# Patient Record
Sex: Male | Born: 1950 | Race: White | Hispanic: No | Marital: Married | Smoking: Former smoker
Health system: Southern US, Community
[De-identification: ages and names within clinical notes are randomized; demographics above are authoritative.]

## PROBLEM LIST (undated history)

## (undated) DIAGNOSIS — R9431 Abnormal electrocardiogram [ECG] [EKG]: Secondary | ICD-10-CM

## (undated) DIAGNOSIS — I712 Thoracic aortic aneurysm, without rupture, unspecified: Secondary | ICD-10-CM

## (undated) DIAGNOSIS — I4891 Unspecified atrial fibrillation: Secondary | ICD-10-CM

## (undated) DIAGNOSIS — Q231 Congenital insufficiency of aortic valve: Secondary | ICD-10-CM

## (undated) DIAGNOSIS — I38 Endocarditis, valve unspecified: Secondary | ICD-10-CM

## (undated) DIAGNOSIS — I10 Essential (primary) hypertension: Secondary | ICD-10-CM

## (undated) DIAGNOSIS — I359 Nonrheumatic aortic valve disorder, unspecified: Secondary | ICD-10-CM

## (undated) HISTORY — DX: Thoracic aortic aneurysm, without rupture, unspecified: I71.20

## (undated) HISTORY — PX: CARDIAC CATHETERIZATION: SHX172

## (undated) HISTORY — PX: APPENDECTOMY: SHX54

## (undated) HISTORY — DX: Essential (primary) hypertension: I10

## (undated) HISTORY — DX: Congenital insufficiency of aortic valve: Q23.1

## (undated) HISTORY — DX: Endocarditis, valve unspecified: I38

## (undated) HISTORY — DX: Unspecified atrial fibrillation: I48.91

## (undated) HISTORY — DX: Abnormal electrocardiogram (ECG) (EKG): R94.31

## (undated) HISTORY — DX: Thoracic aortic aneurysm, without rupture: I71.2

## (undated) HISTORY — DX: Nonrheumatic aortic valve disorder, unspecified: I35.9

---

## 2003-12-06 ENCOUNTER — Encounter: Admission: RE | Admit: 2003-12-06 | Discharge: 2003-12-06 | Payer: Self-pay | Admitting: Family Medicine

## 2005-04-22 ENCOUNTER — Ambulatory Visit: Payer: Self-pay | Admitting: Family Medicine

## 2005-05-10 ENCOUNTER — Encounter: Admission: RE | Admit: 2005-05-10 | Discharge: 2005-05-10 | Payer: Self-pay | Admitting: Family Medicine

## 2005-05-10 ENCOUNTER — Ambulatory Visit: Payer: Self-pay | Admitting: Family Medicine

## 2005-05-25 ENCOUNTER — Ambulatory Visit: Payer: Self-pay

## 2005-06-10 ENCOUNTER — Encounter: Payer: Self-pay | Admitting: Family Medicine

## 2005-06-21 ENCOUNTER — Ambulatory Visit: Payer: Self-pay | Admitting: Cardiovascular Disease

## 2005-07-06 ENCOUNTER — Ambulatory Visit: Payer: Self-pay | Admitting: Family Medicine

## 2005-08-14 ENCOUNTER — Ambulatory Visit: Payer: Self-pay | Admitting: Cardiovascular Disease

## 2005-08-15 ENCOUNTER — Inpatient Hospital Stay (HOSPITAL_COMMUNITY): Admission: EM | Admit: 2005-08-15 | Discharge: 2005-08-18 | Payer: Self-pay | Admitting: Emergency Medicine

## 2005-08-27 ENCOUNTER — Ambulatory Visit: Payer: Self-pay | Admitting: Nurse Practitioner

## 2006-10-27 ENCOUNTER — Ambulatory Visit: Payer: Self-pay | Admitting: Cardiovascular Disease

## 2006-11-07 ENCOUNTER — Ambulatory Visit: Payer: Self-pay | Admitting: Internal Medicine

## 2006-11-08 ENCOUNTER — Inpatient Hospital Stay (HOSPITAL_COMMUNITY): Admission: EM | Admit: 2006-11-08 | Discharge: 2006-11-12 | Payer: Self-pay | Admitting: Emergency Medicine

## 2006-11-08 ENCOUNTER — Encounter: Payer: Self-pay | Admitting: Internal Medicine

## 2006-11-08 ENCOUNTER — Ambulatory Visit: Payer: Self-pay | Admitting: Internal Medicine

## 2006-11-08 ENCOUNTER — Ambulatory Visit: Payer: Self-pay | Admitting: Family Medicine

## 2006-11-09 ENCOUNTER — Ambulatory Visit: Payer: Self-pay | Admitting: Internal Medicine

## 2006-12-05 ENCOUNTER — Ambulatory Visit: Payer: Self-pay | Admitting: Family Medicine

## 2006-12-08 ENCOUNTER — Ambulatory Visit: Payer: Self-pay | Admitting: Cardiology

## 2006-12-08 ENCOUNTER — Ambulatory Visit (HOSPITAL_COMMUNITY): Admission: RE | Admit: 2006-12-08 | Discharge: 2006-12-08 | Payer: Self-pay | Admitting: Cardiology

## 2006-12-08 ENCOUNTER — Encounter: Payer: Self-pay | Admitting: Cardiology

## 2007-03-29 ENCOUNTER — Ambulatory Visit: Payer: Self-pay

## 2007-03-29 ENCOUNTER — Encounter: Payer: Self-pay | Admitting: Cardiology

## 2007-03-29 ENCOUNTER — Ambulatory Visit: Payer: Self-pay | Admitting: Cardiovascular Disease

## 2007-05-30 ENCOUNTER — Inpatient Hospital Stay (HOSPITAL_COMMUNITY): Admission: EM | Admit: 2007-05-30 | Discharge: 2007-06-03 | Payer: Self-pay | Admitting: Emergency Medicine

## 2007-05-30 ENCOUNTER — Encounter (INDEPENDENT_AMBULATORY_CARE_PROVIDER_SITE_OTHER): Payer: Self-pay | Admitting: Emergency Medicine

## 2007-05-30 ENCOUNTER — Ambulatory Visit: Payer: Self-pay | Admitting: Cardiology

## 2007-05-30 ENCOUNTER — Ambulatory Visit: Payer: Self-pay | Admitting: Internal Medicine

## 2007-05-31 ENCOUNTER — Encounter: Payer: Self-pay | Admitting: Cardiovascular Disease

## 2007-05-31 ENCOUNTER — Ambulatory Visit: Payer: Self-pay | Admitting: Infectious Diseases

## 2007-06-02 ENCOUNTER — Encounter: Payer: Self-pay | Admitting: Cardiovascular Disease

## 2007-06-19 ENCOUNTER — Ambulatory Visit: Payer: Self-pay | Admitting: Cardiovascular Disease

## 2007-12-04 ENCOUNTER — Ambulatory Visit: Payer: Self-pay | Admitting: Cardiovascular Disease

## 2007-12-04 LAB — CONVERTED CEMR LAB
ALT: 39 units/L (ref 0–53)
AST: 24 units/L (ref 0–37)
Alkaline Phosphatase: 79 units/L (ref 39–117)
Bilirubin, Direct: 0.1 mg/dL (ref 0.0–0.3)
Total Protein: 7.1 g/dL (ref 6.0–8.3)

## 2007-12-11 ENCOUNTER — Ambulatory Visit: Admission: RE | Admit: 2007-12-11 | Discharge: 2007-12-11 | Payer: Self-pay | Admitting: Cardiovascular Disease

## 2008-01-04 ENCOUNTER — Ambulatory Visit: Payer: Self-pay

## 2008-03-29 ENCOUNTER — Ambulatory Visit: Payer: Self-pay | Admitting: Cardiovascular Disease

## 2008-03-29 LAB — CONVERTED CEMR LAB
BUN: 15 mg/dL (ref 6–23)
Creatinine, Ser: 1 mg/dL (ref 0.4–1.5)
GFR calc Af Amer: 99 mL/min
GFR calc non Af Amer: 82 mL/min
Glucose, Bld: 93 mg/dL (ref 70–99)
Potassium: 4.2 meq/L (ref 3.5–5.1)

## 2008-04-16 ENCOUNTER — Ambulatory Visit: Payer: Self-pay | Admitting: Cardiovascular Disease

## 2008-04-16 ENCOUNTER — Ambulatory Visit (HOSPITAL_COMMUNITY): Admission: RE | Admit: 2008-04-16 | Discharge: 2008-04-16 | Payer: Self-pay | Admitting: Cardiovascular Disease

## 2008-10-14 ENCOUNTER — Ambulatory Visit: Payer: Self-pay | Admitting: Cardiovascular Disease

## 2008-10-17 ENCOUNTER — Ambulatory Visit: Payer: Self-pay

## 2009-04-08 DIAGNOSIS — I10 Essential (primary) hypertension: Secondary | ICD-10-CM | POA: Insufficient documentation

## 2009-04-08 DIAGNOSIS — Q231 Congenital insufficiency of aortic valve: Secondary | ICD-10-CM | POA: Insufficient documentation

## 2009-04-08 DIAGNOSIS — I359 Nonrheumatic aortic valve disorder, unspecified: Secondary | ICD-10-CM | POA: Insufficient documentation

## 2009-04-08 DIAGNOSIS — I4891 Unspecified atrial fibrillation: Secondary | ICD-10-CM | POA: Insufficient documentation

## 2009-04-09 ENCOUNTER — Encounter: Payer: Self-pay | Admitting: Cardiovascular Disease

## 2009-04-09 ENCOUNTER — Ambulatory Visit: Payer: Self-pay | Admitting: Cardiovascular Disease

## 2009-04-23 ENCOUNTER — Ambulatory Visit: Payer: Self-pay

## 2009-04-23 ENCOUNTER — Encounter: Payer: Self-pay | Admitting: Cardiovascular Disease

## 2009-07-22 ENCOUNTER — Ambulatory Visit: Payer: Self-pay | Admitting: Family Medicine

## 2009-07-22 LAB — CONVERTED CEMR LAB
ALT: 26 units/L (ref 0–53)
AST: 23 units/L (ref 0–37)
Alkaline Phosphatase: 85 units/L (ref 39–117)
Basophils Relative: 0.1 % (ref 0.0–3.0)
Bilirubin, Direct: 0.1 mg/dL (ref 0.0–0.3)
Blood in Urine, dipstick: NEGATIVE
Calcium: 9 mg/dL (ref 8.4–10.5)
Creatinine, Ser: 0.9 mg/dL (ref 0.4–1.5)
Eosinophils Absolute: 0.2 10*3/uL (ref 0.0–0.7)
Eosinophils Relative: 2.2 % (ref 0.0–5.0)
GFR calc non Af Amer: 91.57 mL/min (ref >60)
HDL: 45.1 mg/dL (ref >39.00)
Hemoglobin: 16.7 g/dL (ref 13.0–17.0)
Ketones, urine, test strip: NEGATIVE
LDL Cholesterol: 124 mg/dL — ABNORMAL HIGH (ref 0–99)
Lymphocytes Relative: 26.2 % (ref 12.0–46.0)
Monocytes Relative: 6.5 % (ref 3.0–12.0)
Neutrophils Relative %: 65 % (ref 43.0–77.0)
Nitrite: NEGATIVE
RBC: 4.59 M/uL (ref 4.22–5.81)
Sodium: 139 meq/L (ref 135–145)
Total CHOL/HDL Ratio: 4
Total Protein: 6.8 g/dL (ref 6.0–8.3)
Triglycerides: 117 mg/dL (ref 0.0–149.0)
Urobilinogen, UA: 1
WBC Urine, dipstick: NEGATIVE
WBC: 7.1 10*3/uL (ref 4.5–10.5)

## 2009-07-29 ENCOUNTER — Ambulatory Visit: Payer: Self-pay | Admitting: Family Medicine

## 2009-10-21 ENCOUNTER — Ambulatory Visit: Payer: Self-pay

## 2009-10-21 ENCOUNTER — Ambulatory Visit: Payer: Self-pay | Admitting: Cardiovascular Disease

## 2009-10-21 ENCOUNTER — Encounter: Payer: Self-pay | Admitting: Cardiovascular Disease

## 2009-10-21 ENCOUNTER — Ambulatory Visit (HOSPITAL_COMMUNITY): Admission: RE | Admit: 2009-10-21 | Discharge: 2009-10-21 | Payer: Self-pay | Admitting: Cardiovascular Disease

## 2009-10-21 DIAGNOSIS — R9431 Abnormal electrocardiogram [ECG] [EKG]: Secondary | ICD-10-CM | POA: Insufficient documentation

## 2009-12-27 HISTORY — PX: ASCENDING AORTIC ANEURYSM REPAIR W/ MECHANICAL AORTIC VALVE REPLACEMENT: SHX1192

## 2009-12-27 HISTORY — PX: PERICARDIAL WINDOW: SHX2213

## 2010-04-14 ENCOUNTER — Ambulatory Visit: Payer: Self-pay | Admitting: Cardiovascular Disease

## 2010-07-21 ENCOUNTER — Telehealth: Payer: Self-pay | Admitting: Family Medicine

## 2010-07-22 ENCOUNTER — Telehealth: Payer: Self-pay | Admitting: Family Medicine

## 2010-09-28 ENCOUNTER — Ambulatory Visit: Payer: Self-pay | Admitting: Family Medicine

## 2010-09-28 LAB — CONVERTED CEMR LAB
ALT: 19 units/L (ref 0–53)
Albumin: 4.1 g/dL (ref 3.5–5.2)
Basophils Relative: 0.7 % (ref 0.0–3.0)
Bilirubin, Direct: 0.1 mg/dL (ref 0.0–0.3)
Blood in Urine, dipstick: NEGATIVE
Creatinine, Ser: 0.8 mg/dL (ref 0.4–1.5)
Eosinophils Absolute: 0.1 10*3/uL (ref 0.0–0.7)
Eosinophils Relative: 1.3 % (ref 0.0–5.0)
Glucose, Bld: 102 mg/dL — ABNORMAL HIGH (ref 70–99)
Glucose, Urine, Semiquant: NEGATIVE
HCT: 44.8 % (ref 39.0–52.0)
HDL: 56.8 mg/dL (ref >39.00)
Lymphs Abs: 1.7 10*3/uL (ref 0.7–4.0)
MCHC: 34.7 g/dL (ref 30.0–36.0)
MCV: 103.7 fL — ABNORMAL HIGH (ref 78.0–100.0)
Monocytes Absolute: 0.5 10*3/uL (ref 0.1–1.0)
Neutro Abs: 6.8 10*3/uL (ref 1.4–7.7)
Neutrophils Relative %: 74.6 % (ref 43.0–77.0)
PSA: 0.48 ng/mL (ref 0.10–4.00)
Protein, U semiquant: NEGATIVE
RBC: 4.32 M/uL (ref 4.22–5.81)
Sodium: 138 meq/L (ref 135–145)
TSH: 1.92 microintl units/mL (ref 0.35–5.50)
Total Protein: 6.5 g/dL (ref 6.0–8.3)
Urobilinogen, UA: 0.2
VLDL: 10.4 mg/dL (ref 0.0–40.0)
WBC Urine, dipstick: NEGATIVE
WBC: 9.1 10*3/uL (ref 4.5–10.5)
pH: 5.5

## 2010-10-06 ENCOUNTER — Ambulatory Visit: Payer: Self-pay | Admitting: Family Medicine

## 2010-10-20 ENCOUNTER — Encounter (INDEPENDENT_AMBULATORY_CARE_PROVIDER_SITE_OTHER): Payer: Self-pay | Admitting: *Deleted

## 2010-10-23 ENCOUNTER — Ambulatory Visit: Payer: Self-pay | Admitting: Cardiovascular Disease

## 2010-10-23 ENCOUNTER — Encounter: Payer: Self-pay | Admitting: Cardiovascular Disease

## 2010-10-23 ENCOUNTER — Encounter (INDEPENDENT_AMBULATORY_CARE_PROVIDER_SITE_OTHER): Payer: Self-pay | Admitting: *Deleted

## 2010-10-23 ENCOUNTER — Ambulatory Visit (HOSPITAL_COMMUNITY): Admission: RE | Admit: 2010-10-23 | Discharge: 2010-10-23 | Payer: Self-pay | Admitting: Cardiovascular Disease

## 2010-10-23 ENCOUNTER — Ambulatory Visit: Payer: Self-pay | Admitting: Cardiology

## 2010-10-23 LAB — CONVERTED CEMR LAB
Basophils Absolute: 0.1 10*3/uL (ref 0.0–0.1)
Basophils Relative: 0.8 % (ref 0.0–3.0)
CO2: 33 meq/L — ABNORMAL HIGH (ref 19–32)
Calcium: 9 mg/dL (ref 8.4–10.5)
Creatinine, Ser: 0.9 mg/dL (ref 0.4–1.5)
Eosinophils Absolute: 0.1 10*3/uL (ref 0.0–0.7)
GFR calc non Af Amer: 97.4 mL/min (ref >60)
Hemoglobin: 16.1 g/dL (ref 13.0–17.0)
INR: 1 (ref 0.8–1.0)
Lymphocytes Relative: 33.6 % (ref 12.0–46.0)
MCHC: 34.9 g/dL (ref 30.0–36.0)
Monocytes Relative: 9 % (ref 3.0–12.0)
Neutro Abs: 4 10*3/uL (ref 1.4–7.7)
Neutrophils Relative %: 54.8 % (ref 43.0–77.0)
RBC: 4.46 M/uL (ref 4.22–5.81)
Sodium: 138 meq/L (ref 135–145)

## 2010-10-26 ENCOUNTER — Telehealth: Payer: Self-pay | Admitting: Internal Medicine

## 2010-11-02 ENCOUNTER — Telehealth (INDEPENDENT_AMBULATORY_CARE_PROVIDER_SITE_OTHER): Payer: Self-pay | Admitting: *Deleted

## 2010-11-02 ENCOUNTER — Encounter (INDEPENDENT_AMBULATORY_CARE_PROVIDER_SITE_OTHER): Payer: Self-pay | Admitting: *Deleted

## 2010-11-02 ENCOUNTER — Inpatient Hospital Stay (HOSPITAL_BASED_OUTPATIENT_CLINIC_OR_DEPARTMENT_OTHER): Admission: RE | Admit: 2010-11-02 | Discharge: 2010-11-02 | Payer: Self-pay | Admitting: Cardiovascular Disease

## 2010-11-02 DIAGNOSIS — I712 Thoracic aortic aneurysm, without rupture, unspecified: Secondary | ICD-10-CM | POA: Insufficient documentation

## 2010-11-06 ENCOUNTER — Ambulatory Visit: Payer: Self-pay | Admitting: Cardiology

## 2010-11-10 ENCOUNTER — Encounter: Payer: Self-pay | Admitting: Cardiovascular Disease

## 2010-11-13 ENCOUNTER — Telehealth: Payer: Self-pay | Admitting: Cardiovascular Disease

## 2010-11-17 ENCOUNTER — Ambulatory Visit: Payer: Self-pay | Admitting: Cardiovascular Disease

## 2010-11-24 ENCOUNTER — Encounter: Payer: Self-pay | Admitting: Family Medicine

## 2010-11-24 ENCOUNTER — Ambulatory Visit: Payer: Self-pay | Admitting: Surgery

## 2010-12-01 ENCOUNTER — Ambulatory Visit: Payer: Self-pay | Admitting: Surgery

## 2010-12-03 ENCOUNTER — Encounter: Payer: Self-pay | Admitting: Cardiovascular Disease

## 2010-12-03 ENCOUNTER — Inpatient Hospital Stay (HOSPITAL_COMMUNITY)
Admission: RE | Admit: 2010-12-03 | Discharge: 2010-12-08 | Payer: Self-pay | Source: Home / Self Care | Attending: Surgery | Admitting: Surgery

## 2010-12-03 ENCOUNTER — Encounter: Payer: Self-pay | Admitting: Surgery

## 2010-12-06 ENCOUNTER — Encounter: Payer: Self-pay | Admitting: Cardiovascular Disease

## 2010-12-08 ENCOUNTER — Encounter: Payer: Self-pay | Admitting: Cardiovascular Disease

## 2010-12-10 ENCOUNTER — Ambulatory Visit: Payer: Self-pay | Admitting: Internal Medicine

## 2010-12-10 LAB — CONVERTED CEMR LAB: POC INR: 5

## 2010-12-11 ENCOUNTER — Encounter: Payer: Self-pay | Admitting: Cardiology

## 2010-12-11 ENCOUNTER — Inpatient Hospital Stay (HOSPITAL_COMMUNITY)
Admission: EM | Admit: 2010-12-11 | Discharge: 2010-12-16 | Payer: Self-pay | Source: Home / Self Care | Attending: Cardiothoracic Surgery | Admitting: Cardiothoracic Surgery

## 2010-12-17 ENCOUNTER — Ambulatory Visit: Payer: Self-pay

## 2010-12-18 ENCOUNTER — Ambulatory Visit: Payer: Self-pay | Admitting: Cardiology

## 2010-12-22 ENCOUNTER — Ambulatory Visit: Payer: Self-pay | Admitting: Surgery

## 2010-12-22 ENCOUNTER — Encounter
Admission: RE | Admit: 2010-12-22 | Discharge: 2010-12-22 | Payer: Self-pay | Source: Home / Self Care | Attending: Surgery | Admitting: Surgery

## 2010-12-22 ENCOUNTER — Telehealth: Payer: Self-pay | Admitting: Cardiovascular Disease

## 2010-12-25 ENCOUNTER — Telehealth: Payer: Self-pay | Admitting: Cardiovascular Disease

## 2010-12-25 ENCOUNTER — Ambulatory Visit: Payer: Self-pay | Admitting: Cardiology

## 2010-12-25 LAB — CONVERTED CEMR LAB: POC INR: 3.1

## 2010-12-29 ENCOUNTER — Other Ambulatory Visit: Payer: Self-pay | Admitting: Cardiology

## 2010-12-29 ENCOUNTER — Ambulatory Visit: Admission: RE | Admit: 2010-12-29 | Discharge: 2010-12-29 | Payer: Self-pay | Source: Home / Self Care

## 2010-12-29 ENCOUNTER — Ambulatory Visit (HOSPITAL_COMMUNITY)
Admission: RE | Admit: 2010-12-29 | Discharge: 2010-12-29 | Payer: Self-pay | Source: Home / Self Care | Attending: Cardiology | Admitting: Cardiology

## 2011-01-01 ENCOUNTER — Ambulatory Visit: Admission: RE | Admit: 2011-01-01 | Discharge: 2011-01-01 | Payer: Self-pay | Source: Home / Self Care

## 2011-01-15 ENCOUNTER — Ambulatory Visit: Admission: RE | Admit: 2011-01-15 | Discharge: 2011-01-15 | Payer: Self-pay | Source: Home / Self Care

## 2011-01-21 ENCOUNTER — Ambulatory Visit
Admission: RE | Admit: 2011-01-21 | Discharge: 2011-01-21 | Payer: Self-pay | Source: Home / Self Care | Attending: Cardiovascular Disease | Admitting: Cardiovascular Disease

## 2011-01-26 NOTE — Assessment & Plan Note (Signed)
Summary: cpx//ccm   Vital Signs:  Patient profile:   60 year old male Weight:      208 pounds Temp:     98.7 degrees F oral Pulse rate:   67 / minute Pulse rhythm:   regular BP sitting:   140 / 68  (left arm) Cuff size:   regular  Vitals Entered By: Mervin Hack CMA Duncan Dull) (October 06, 2010 2:52 PM) CC: adult physical   CC:  adult physical.  History of Present Illness: Alexander Payne is a 60 year old, married male, nonsmoker comes in today for evaluation of hypertension, and aortic stenosis.  His last echocardiogram showed an ejection fraction of between 55 and 60%.  Mean gradient was 36, however, he is asymptomatic.  He does not really exert himself.  But he has no chest pain, shortness of breath, lightheadedness, et Karie Soda.  He is currently on lisinopril 5 mg daily, beta-blocker, 25 mg one half tablet b.i.d., aspirin daily, along with amiodiorone 200 mg daily because of a history of PAF.  he  was told recently that he has the beginnings of cataracts, although his vision is able to be corrected 20/ 20 with glasses.  Rescue systems negative.  Tetanus 2004 season flu shot today.  He said his screening flexible sigmoidoscopy in the past, but never has had a colonoscopy............ again referred for colonoscopy!!!!!!!!!!!  Allergies: No Known Drug Allergies  Past History:  Past medical, surgical, family and social histories (including risk factors) reviewed, and no changes noted (except as noted below).  Past Medical History: Reviewed history from 04/09/2009 and no changes required. Hypertension AS bicuspid valve PAF Hypertension  Past Surgical History: Reviewed history from 04/08/2009 and no changes required.  Significant for coronary disease.   Family History: Reviewed history from 04/09/2009 and no changes required. non-contributory  Social History: Reviewed history from 04/09/2009 and no changes required. Works at [employer] Non-smoker Non-drinker Sedentary  Review of Systems      See HPI  Physical Exam  General:  Well-developed,well-nourished,in no acute distress; alert,appropriate and cooperative throughout examination Head:  Normocephalic and atraumatic without obvious abnormalities. No apparent alopecia or balding. Eyes:  No corneal or conjunctival inflammation noted. EOMI. Perrla. Funduscopic exam benign, without hemorrhages, exudates or papilledema. Vision grossly normal. Ears:  External ear exam shows no significant lesions or deformities.  Otoscopic examination reveals clear canals, tympanic membranes are intact bilaterally without bulging, retraction, inflammation or discharge. Hearing is grossly normal bilaterally. Nose:  External nasal examination shows no deformity or inflammation. Nasal mucosa are pink and moist without lesions or exudates. Mouth:  Oral mucosa and oropharynx without lesions or exudates.  Teeth in good repair. Neck:  No deformities, masses, or tenderness noted. Chest Wall:  No deformities, masses, tenderness or gynecomastia noted. Breasts:  No masses or gynecomastia noted Lungs:  Normal respiratory effort, chest expands symmetrically. Lungs are clear to auscultation, no crackles or wheezes. Heart:  the PMI is not palpable.  There is a grade 4/6 systolic ejection murmur heard in the carotids.  It also audible in the aorta.  No diastolic murmur Abdomen:  Bowel sounds positive,abdomen soft and non-tender without masses, organomegaly or hernias noted. Rectal:  No external abnormalities noted. Normal sphincter tone. No rectal masses or tenderness. Genitalia:  Testes bilaterally descended without nodularity, tenderness or masses. No scrotal masses or lesions. No penis lesions or urethral discharge. Prostate:  Prostate gland firm and smooth, no enlargement, nodularity, tenderness, mass, asymmetry or induration. Msk:  No deformity or scoliosis  noted of thoracic or lumbar spine.    Pulses:  R and L carotid,radial,femoral,dorsalis pedis and posterior tibial pulses are full and equal bilaterally Extremities:  No clubbing, cyanosis, edema, or deformity noted with normal full range of motion of all joints.   Neurologic:  No cranial nerve deficits noted. Station and gait are normal. Plantar reflexes are down-going bilaterally. DTRs are symmetrical throughout. Sensory, motor and coordinative functions appear intact. Skin:  Intact without suspicious lesions or rashes Cervical Nodes:  No lymphadenopathy noted Axillary Nodes:  No palpable lymphadenopathy Inguinal Nodes:  No significant adenopathy Psych:  Cognition and judgment appear intact. Alert and cooperative with normal attention span and concentration. No apparent delusions, illusions, hallucinations   Impression & Recommendations:  Problem # 1:  HYPERTENSION (ICD-401.9) Assessment Improved  His updated medication list for this problem includes:    Lisinopril 5 Mg Tabs (Lisinopril) .Marland Kitchen... Take 1 tablet by mouth once a day    Metoprolol Tartrate 25 Mg Tabs (Metoprolol tartrate) .Marland Kitchen... 1/2 tab by mouth two times a day  Orders: Prescription Created Electronically [phone])  Problem # 2:  AORTIC STENOSIS (ICD-424.1) Assessment: Unchanged  His updated medication list for this problem includes:    Amiodarone Hcl 200 Mg Tabs (Amiodarone hcl) .Marland Kitchen... Take one tab by mouth once daily    Metoprolol Tartrate 25 Mg Tabs (Metoprolol tartrate) .Marland Kitchen... 1/2 tab by mouth two times a day    Aspirin 325 Mg Tabs (Aspirin) .Marland Kitchen... 1 tab by mouth once daily  Orders: Prescription Created Electronically [phone])  Problem # 3:  Preventive Health Care (ICD-V70.0) Assessment: Unchanged  Complete Medication List: 1)  Amiodarone Hcl 200 Mg Tabs (Amiodarone hcl) .... Take one tab by mouth once daily 2)  Lisinopril 5 Mg Tabs (Lisinopril) .... Take 1 tablet by mouth once a day 3)  Metoprolol Tartrate 25 Mg Tabs (Metoprolol tartrate) .... 1/2 tab by  mouth two times a day 4)  Aspirin 325 Mg Tabs (Aspirin) .Marland Kitchen.. 1 tab by mouth once daily 5)  Fish Oil Oil (Fish oil) .Marland Kitchen.. 1 tab by mouth once daily 6)  Multivitamins Tabs (Multiple vitamin) .Marland Kitchen.. 1 podaily 7)  Krill Oil 1000 Mg Caps (Krill oil) .Marland Kitchen.. 1 by mouth daily  Other Orders: Flu Vaccine 37yrs + [phone]) Admin 1st Vaccine (91478) Admin 1st Vaccine [facility]) [id number]) Gastroenterology Referral (GI)  Patient Instructions: 1)  Please schedule a follow-up appointment in 1 year. 2)  Schedule a colonoscopy/sigmoidoscopy to help detect colon cancer. Prescriptions: METOPROLOL TARTRATE 25 MG TABS (METOPROLOL TARTRATE) 1/2 tab by mouth two times a day  #100 x 3   Entered and Authorized by:   Roderick Pee MD   Signed by:   Roderick Pee MD on 10/06/2010   Method used:   Print then Give to Patient   RxID:   [id number] LISINOPRIL 5 MG TABS (LISINOPRIL) Take 1 tablet by mouth once a day  #100 x 3   Entered and Authorized by:   Roderick Pee MD   Signed by:   Roderick Pee MD on 10/06/2010   Method used:   Print then Give to Patient   RxID:   [id number] AMIODARONE HCL 200 MG TABS (AMIODARONE HCL) take one tab by mouth once daily  #100 x 3   Entered and Authorized by:   Roderick Pee MD   Signed by:   Roderick Pee MD on 10/06/2010   Method used:   Print then Give to Patient   RxID:  [id number] METOPROLOL TARTRATE 25 MG TABS (METOPROLOL TARTRATE) 1/2 tab by mouth two times a day  #100 x 3   Entered and Authorized by:   Roderick Pee MD   Signed by:   Roderick Pee MD on 10/06/2010   Method used:   Electronically to        [facility] (retail)       [address]       [place], [place]  [address]       Ph: [phone]       Fax: [phone]   RxID:   [id number] LISINOPRIL 5 MG TABS (LISINOPRIL) Take 1 tablet by mouth once a day  #100 x 3   Entered and Authorized by:   Roderick Pee MD   Signed by:   Roderick Pee MD on 10/06/2010   Method used:    Electronically to        [facility] (retail)       [address]       [place], [place]  [address]       Ph: [phone]       Fax: [phone]   RxID:   [id number] AMIODARONE HCL 200 MG TABS (AMIODARONE HCL) take one tab by mouth once daily  #100 x 3   Entered and Authorized by:   Roderick Pee MD   Signed by:   Roderick Pee MD on 10/06/2010   Method used:   Electronically to        [facility] (retail)       [address]       [place], [place]  [address]       Ph: [phone]       Fax: [phone]   RxID:   [id number]    Influenza Vaccine    Vaccine Type: Fluvax 3+    Site: left deltoid    Mfr: GlaxoSmithKline    Dose: 0.5 ml    Route: IM    Given by: Mervin Hack CMA (AAMA)    Exp. Date: 06/26/2011    Lot #: [id number]    VIS given: 07/21/10 version given October 06, 2010.  Flu Vaccine Consent Questions    Do you have a history of severe allergic reactions to this vaccine? no    Any prior history of allergic reactions to egg and/or gelatin? no    Do you have a sensitivity to the preservative Thimersol? no    Do you have a past history of Guillan-Barre Syndrome? no    Do you currently have an acute febrile illness? no    Have you ever had a severe reaction to latex? no    Vaccine information given and explained to patient? yes   Current Allergies (reviewed today): No known allergies

## 2011-01-26 NOTE — Consult Note (Signed)
Summary: [facility]  [facility]   Imported By: Sherian Rein 06/06/2010 08:47:46  _____________________________________________________________________  External Attachment:    Type:   Image     Comment:   External Document

## 2011-01-26 NOTE — Miscellaneous (Signed)
  Clinical Lists Changes  Orders: Added new Referral order of [facility]CTS Referral (TCTS Ref) - Signed

## 2011-01-26 NOTE — Assessment & Plan Note (Signed)
Summary: [name]   CC:  check up.  History of Present Illness: Alexander Payne is seen today for F/U of AS.  He has a known bicuspid valve.  Previous mean gradient was around 40mm Hg.  I reviewed his echo from today and mean gradient is now 55 with peak velocity up to 4.8 cm/sec from right parasternal.  Septal thickness is 16mm  Despite continuing to be "aysmptomatic" I think the patient should proceed to AVR given the progression of disease by echo criteria.  At age 29 he may opt for a pericardial valve and no coumadin.  His BP is under good control.  He has had a nonischemic myovue I believe in 2008 with no known CAD.  We will need to do an aortogram to size his aortic root.  The indications for proceeding with cath and AVR were discussed.  Risks of stroke and bleeding with cath discussed.  Reviewed last two echos with patient.  Suggested Dr Laneta Simmers as Careers adviser and will arrange appt with him post cath.    Current Problems (verified): 1)  Routine General Medical Exam@health  Care Facl  (ICD-V70.0) 2)  Electrocardiogram, Abnormal  (ICD-794.31) 3)  Bicuspid Aortic Valve  (ICD-746.4) 4)  Paroxysmal Atrial Fibrillation  (ICD-427.31) 5)  Hypertension  (ICD-401.9) 6)  Aortic Stenosis  (ICD-424.1)  Current Medications (verified): 1)  Amiodarone Hcl 200 Mg Tabs (Amiodarone Hcl) .... Take One Tab By Mouth Once Daily 2)  Lisinopril 5 Mg Tabs (Lisinopril) .... Take 1 Tablet By Mouth Once A Day 3)  Metoprolol Tartrate 25 Mg Tabs (Metoprolol Tartrate) .... 1/2 Tab By Mouth Two Times A Day 4)  Aspirin 325 Mg  Tabs (Aspirin) .Marland Kitchen.. 1 Tab By Mouth Once Daily 5)  Fish Oil   Oil (Fish Oil) .Marland Kitchen.. 1 Tab By Mouth Once Daily 6)  Multivitamins   Tabs (Multiple Vitamin) .Marland Kitchen.. 1 Podaily  Allergies (verified): No Known Drug Allergies  Past History:  Past Medical History: Last updated: 04/09/2009 Hypertension AS bicuspid valve PAF Hypertension  Past Surgical History: Last updated: 04/08/2009  Significant for coronary  disease.   Family History: Last updated: 04/09/2009 non-contributory  Social History: Last updated: 04/09/2009 Works at [employer] Non-smoker Non-drinker Sedentary  Review of Systems       Denies fever, malais, weight loss, blurry vision, decreased visual acuity, cough, sputum, SOB, hemoptysis, pleuritic pain, palpitaitons, heartburn, abdominal pain, melena, lower extremity edema, claudication, or rash.   Vital Signs:  Patient profile:   60 year old male Height:      75 inches Weight:      211 pounds BMI:     26.47 Pulse rate:   56 / minute Resp:     14 per minute BP sitting:   141 / 67  (left arm)  Vitals Entered By: Kem Parkinson (October 23, 2010 11:27 AM)  Physical Exam  General:  Affect appropriate Healthy:  appears stated age HEENT: normal Neck supple with no adenopathy JVP normal no bruits no thyromegaly Lungs clear with no wheezing and good diaphragmatic motion Heart:  S1/S2  gone  AS and AR  murmur no ,rub, gallop or click PMI normal Abdomen: benighn, BS positve, no tenderness, no AAA no bruit.  No HSM or HJR Distal pulses intact with no bruits No edema Neuro non-focal Skin warm and dry    Impression & Recommendations:  Problem # 1:  BICUSPID AORTIC VALVE (ICD-746.4) Severe with progression of disease.  CAth and surgical referral to be arranged His updated medication  list for this problem includes:    Amiodarone Hcl 200 Mg Tabs (Amiodarone hcl) .Marland Kitchen... Take one tab by mouth once daily    Lisinopril 5 Mg Tabs (Lisinopril) .Marland Kitchen... Take 1 tablet by mouth once a day    Metoprolol Tartrate 25 Mg Tabs (Metoprolol tartrate) .Marland Kitchen... 1/2 tab by mouth two times a day    Aspirin 325 Mg Tabs (Aspirin) .Marland Kitchen... 1 tab by mouth once daily  Problem # 2:  PAROXYSMAL ATRIAL FIBRILLATION (ICD-427.31) Maint NSR  Increase risk of PAF post op.  Continue amiodarone His updated medication list for this problem includes:    Amiodarone Hcl 200 Mg Tabs  (Amiodarone hcl) .Marland Kitchen... Take one tab by mouth once daily    Metoprolol Tartrate 25 Mg Tabs (Metoprolol tartrate) .Marland Kitchen... 1/2 tab by mouth two times a day    Aspirin 325 Mg Tabs (Aspirin) .Marland Kitchen... 1 tab by mouth once daily  Orders: TLB-CBC Platelet - w/Differential (85025-CBCD) TLB-PT (Protime) (85610-PTP)  Problem # 3:  HYPERTENSION (ICD-401.9) Well controlled Avoid excess afterload reduction with fixed AS His updated medication list for this problem includes:    Lisinopril 5 Mg Tabs (Lisinopril) .Marland Kitchen... Take 1 tablet by mouth once a day    Metoprolol Tartrate 25 Mg Tabs (Metoprolol tartrate) .Marland Kitchen... 1/2 tab by mouth two times a day    Aspirin 325 Mg Tabs (Aspirin) .Marland Kitchen... 1 tab by mouth once daily  Orders: TLB-BMP (Basic Metabolic Panel-BMET) (80048-METABOL)  Other Orders: Cardiac Catheterization (Cardiac Cath) T-2 View CXR (71020TC)  Patient Instructions: 1)  Your physician recommends that you schedule a follow-up appointment in: 4 WEEKS 2)  Your physician has requested that you have a cardiac catheterization.  Cardiac catheterization is used to diagnose and/or treat various heart conditions. Doctors may recommend this procedure for a number of different reasons. The most common reason is to evaluate chest pain. Chest pain can be a symptom of coronary artery disease (CAD), and cardiac catheterization can show whether plaque is narrowing or blocking your heart's arteries. This procedure is also used to evaluate the valves, as well as measure the blood flow and oxygen levels in different parts of your heart.  For further information please visit [email or web address]  Please follow instruction sheet, as given.

## 2011-01-26 NOTE — Miscellaneous (Signed)
Summary: Orders Update  Clinical Lists Changes  Orders: Added new Referral order of [facility] Referral ([facility] Ref) - Signed 

## 2011-01-26 NOTE — Progress Notes (Signed)
  Phone Note Other Incoming   Caller: dr Charlton Haws Summary of Call: pt needing to be scheduled for CTA of chest for evaluation of dilated aortic root. scheduled for friday 11-06-10@ 1pm in the [place] office on [address]. pt aware. 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

## 2011-01-26 NOTE — Letter (Signed)
Summary: Pre Visit Letter Revised  [facility]  [address] [place], [place] [address]   Phone: [phone]  Fax: [phone]        10/20/2010 MRN: [id number] [facility] [address] [place], [place]  [address]             Procedure Date:  11-26-10   Welcome to the Gastroenterology Division at [facility].    You are scheduled to see a nurse for your pre-procedure visit on 11-12-10 at 8:00a.m. on the 3rd floor at [facility], [address]  We ask that you try to arrive at our office 15 minutes prior to your appointment time to allow for check-in.  Please take a minute to review the attached form.  If you answer "Yes" to one or more of the questions on the first page, we ask that you call the person listed at your earliest opportunity.  If you answer "No" to all of the questions, please complete the rest of the form and bring it to your appointment.    Your nurse visit will consist of discussing your medical and surgical history, your immediate family medical history, and your medications.   If you are unable to list all of your medications on the form, please bring the medication bottles to your appointment and we will list them.  We will need to be aware of both prescribed and over the counter drugs.  We will need to know exact dosage information as well.    Please be prepared to read and sign documents such as consent forms, a financial agreement, and acknowledgement forms.  If necessary, and with your consent, a friend or relative is welcome to sit-in on the nurse visit with you.  Please bring your insurance card so that we may make a copy of it.  If your insurance requires a referral to see a specialist, please bring your referral form from your primary care physician.  No co-pay is required for this nurse visit.     If you cannot keep your appointment, please call [phone] to cancel or reschedule prior to your appointment date.  This  allows [place] the opportunity to schedule an appointment for another patient in need of care.    Thank you for choosing [facility] for your medical needs.  We appreciate the opportunity to care for you.  Please visit [place] at our website  to learn more about our practice.  Sincerely, The Gastroenterology Division

## 2011-01-26 NOTE — Miscellaneous (Signed)
Summary: Appointment Canceled  Appointment status changed to canceled by [employer] on 10/06/2010 4:59 PM.  Cancellation Comments --------------------- echo-AS  Appointment Information ----------------------- Appt Type:  CARDIOLOGY ANCILLARY VISIT      Date:  Thursday, October 15, 2010      Time:  9:30 AM for 60 min   Urgency:  Routine   Made By:  Pearson Grippe  To Visit:  [id number]    Reason:  echo-AS  Appt Comments ------------- -- 10/06/10 16:59: ([facility]) CANCELED -- echo-AS -- 08/14/10 16:12: ([facility]) BOOKED -- Routine CARDIOLOGY ANCILLARY VISIT at 10/15/2010 9:30 AM for 60 min echo-AS

## 2011-01-26 NOTE — Consult Note (Signed)
Summary: [facility]  [facility]   Imported By: Maryln Gottron 12/03/2010 10:07:11  _____________________________________________________________________  External Attachment:    Type:   Image     Comment:   External Document

## 2011-01-26 NOTE — Progress Notes (Signed)
Summary: Pt changing from [facility] to [facility]. Sending refill req  Phone Note Refill Request Message from:  Pharmacy on July 22, 2010 10:06 AM  Refills Requested: Medication #1:  METOPROLOL TARTRATE 25 MG TABS 1/2 tab by mouth two times a day   Dosage confirmed as above?Dosage Confirmed  Medication #2:  LISINOPRIL 5 MG TABS Take 1 tablet by mouth once a day   Dosage confirmed as above?Dosage Confirmed Initial call taken by: Kern Reap CMA Duncan Dull),  July 22, 2010 10:59 AM Caller: spouse - Erskine Squibb Summary of Call: [facility] is faxing a req to get pts Metoprolol, Lisinopril and Amiodarone 200mg . Pt is changing from [facility] to [facility] order.  Pt just wanted to make Dr. Tawanna Cooler aware.  Initial call taken by: Lucy Antigua,  July 21, 2010 11:35 AM    Prescriptions: METOPROLOL TARTRATE 25 MG TABS (METOPROLOL TARTRATE) 1/2 tab by mouth two times a day  #100 x 3   Entered by:   Kern Reap CMA (AAMA)   Authorized by:   Roderick Pee MD   Signed by:   Kern Reap CMA (AAMA) on 07/22/2010   Method used:   Faxed to ...       [facility] (mail-order)             , [place]         Ph: [phone]       Fax: [phone]   RxID:   [id number] LISINOPRIL 5 MG TABS (LISINOPRIL) Take 1 tablet by mouth once a day  #100 x 3   Entered by:   Kern Reap CMA (AAMA)   Authorized by:   Roderick Pee MD   Signed by:   Kern Reap CMA (AAMA) on 07/22/2010   Method used:   Faxed to ...       [facility] (mail-order)             , [place]         Ph: [phone]       Fax: [phone]   RxID:   [id number]

## 2011-01-26 NOTE — Assessment & Plan Note (Signed)
Summary: F1M/POST CATH/DM   CC:  follow up.  History of Present Illness: Alexander Payne is seen post cath. He has severe AS and moderate AR with progressive gradient.  Cors showed only 30-40% LAD disease probably not severe enough to bypass.  CT showed ascending aorta of 4.8 and he will need a Bental.  I suspect that he would be best served with a mechanical valve but I am not sure he wants to be on coumadin.  He will discuss this further with Dr Laneta Simmers  He will likely have surgery after the holidays.  He denies SSCP, syncope and only mild exertinal dyspnea.  Cath sight has healed well.  Compliant with meds.  Will continue amiodarone to decrease risk of perop afib.  Risk of pacer post op alos discussed with patient.  Cardiovascular Risk History:      Positive major cardiovascular risk factors include male age 60 years old or older and hypertension.        Positive history for target organ damage include peripheral vascular disease.     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 Take One Tab By Mouth Once Daily 2)  Lisinopril 5 Mg Tabs (Lisinopril) .... Take 1 Tablet By Mouth Once A Day 3)  Metoprolol Tartrate 25 Mg Tabs (Metoprolol Tartrate) .... 1/2 Tab By Mouth Two Times A Day 4)  Aspirin 325 Mg  Tabs (Aspirin) .Marland Kitchen.. 1 Tab By Mouth Once Daily 5)  Fish Oil   Oil (Fish Oil) .Marland Kitchen.. 1 Tab By Mouth Once Daily 6)  Multivitamins   Tabs (Multiple Vitamin) .Marland Kitchen.. 1 Podaily  Allergies (verified): No Known Drug Allergies  Past History:  Past Medical History: Last updated: 04/09/2009 Hypertension AS bicuspid  valve PAF Hypertension  Past Surgical History: Last updated: 04/08/2009  Significant for coronary disease.   Family History: Last updated: 04/09/2009 non-contributory  Social History: Last updated: 04/09/2009 Works at [employer] Non-smoker Non-drinker Sedentary  Review of Systems       Denies fever, malais, weight loss, blurry vision, decreased visual acuity, cough, sputum, SOB, hemoptysis, pleuritic pain, palpitaitons, heartburn, abdominal pain, melena, lower extremity edema, claudication, or rash.   Vital Signs:  Patient profile:   60 year old male Height:      75 inches Weight:      216 pounds BMI:     27.10 Pulse rate:   62 / minute Resp:     14 per minute BP sitting:   142 / 74  (left arm)  Vitals Entered By: Kem Parkinson (November 17, 2010 9:58 AM)  Physical Exam  General:  Affect appropriate Healthy:  appears stated age HEENT: normal Neck supple with no adenopathy JVP normal no bruits no thyromegaly Lungs clear with no wheezing and good diaphragmatic motion Heart:  S1/S2  severe AS murmur and AR murmur no ,rub, gallop or click PMI normal Abdomen: benighn, BS positve, no tenderness, no AAA no bruit.  No HSM or HJR Distal pulses intact with no bruits No edema Neuro non-focal Skin warm and dry    Impression & Recommendations:  Problem # 1:  ANEURYSM, THORACIC AORTIC (ICD-441.2) Will need Bental procedure.  Problem # 2:  BICUSPID AORTIC VALVE (ICD-746.4) To see Dr Laneta Simmers regarding AVR and likely proceed with surgery including root replacement after the holidays His updated medication list for this problem includes:    Amiodarone Hcl 200 Mg Tabs (Amiodarone hcl) .Marland Kitchen... Take one tab by mouth once daily    Lisinopril 5 Mg Tabs (Lisinopril) .Marland Kitchen... Take 1 tablet by mouth once a day    Metoprolol Tartrate 25 Mg Tabs (Metoprolol tartrate) .Marland Kitchen... 1/2 tab by mouth two times a day    Aspirin 325 Mg Tabs (Aspirin) .Marland Kitchen... 1 tab by mouth  once daily  Problem # 3:  HYPERTENSION (ICD-401.9) Well controlled.  Avoid excessive afterload reduction with fixed AS His updated medication list for this problem includes:    Lisinopril 5 Mg Tabs (Lisinopril) .Marland Kitchen... Take 1 tablet by mouth once a day    Metoprolol Tartrate 25 Mg Tabs (Metoprolol tartrate) .Marland Kitchen... 1/2 tab by mouth two times a day    Aspirin 325 Mg Tabs (Aspirin) .Marland Kitchen... 1 tab by mouth once daily  Problem # 4:  PAROXYSMAL ATRIAL FIBRILLATION (ICD-427.31) Maint NSR consider D/C amiodarone 1 month post op. His updated medication list for this problem includes:    Amiodarone Hcl 200 Mg Tabs (Amiodarone hcl) .Marland Kitchen... Take one tab by mouth once daily    Metoprolol Tartrate 25 Mg Tabs (Metoprolol tartrate) .Marland Kitchen... 1/2 tab by mouth two times a day    Aspirin 325 Mg Tabs (Aspirin) .Marland Kitchen... 1 tab by mouth once daily  Cardiovascular Risk Assessment/Plan:      The patient's hypertensive risk group is category C: Target organ damage and/or diabetes.  His calculated 10 year risk of coronary heart disease is 18 %.  Today's blood pressure is 142/74.    Patient Instructions: 1)  Your physician recommends that you schedule a follow-up appointment in: JAN

## 2011-01-26 NOTE — Progress Notes (Signed)
Summary: calling about CT scan  Phone Note Call from Patient Call back at Work Phone [phone]   Caller: Patient Summary of Call: Pt calling regarding his CT scan,and pt want to know if he should keep appt with Dr.Nishan on 11/17/10 Initial call taken by: Judie Grieve,  November 13, 2010 4:40 PM  Follow-up for Phone Call        Phone Call Completed pt instructed to keep appt with dr Eden Emms and also will need to make arrangements for pt to see dr bartle  for avr.per pt dr bartle's off has not made appt. Follow-up by: Scherrie Bateman, LPN,  November 13, 2010 4:57 PM

## 2011-01-26 NOTE — Progress Notes (Signed)
Summary: previsit letter ?'s  Phone Note Call from Patient Call back at Work Phone [phone]   Caller: Patient Call For: Dr. Leone Payor Reason for Call: Talk to Nurse Summary of Call: previsit letter ?'s Initial call taken by: Vallarie Mare,  October 26, 2010 9:37 AM  Follow-up for Phone Call        pt has a bicuspid aortic valve and will be having a cath on 11/7.  Pt is scheduled for colon on 12/1.  Does the patient need to wait for cardiac clearance or is he OK to continue as scheduled? Follow-up by: Francee Piccolo CMA Duncan Dull),  October 26, 2010 2:03 PM  Additional Follow-up for Phone Call Additional follow up Details #1::        I would not do it now due to aortic stenosis work-up. cancel the procedure and replace the recall for 6 months from now and will review things then - assuming it is screening which is what it looks like cc: Dr. Eden Emms when note complete Additional Follow-up by: Iva Boop MD, Clementeen Graham,  October 27, 2010 6:16 AM    Additional Follow-up for Phone Call Additional follow up Details #2::    notified pt of above.  He is agreeable.  Appt's cancelled in [facility].  Recall for 6 months placed in IDX.  Advised pt to call us if he begins having GI problems, otherwise we will call him in 6 month to schedule. Follow-up by: Francee Piccolo CMA Duncan Dull),  October 27, 2010 9:39 AM

## 2011-01-26 NOTE — Progress Notes (Signed)
Summary: refills  Phone Note Refill Request      New/Updated Medications: AMIODARONE HCL 200 MG TABS (AMIODARONE HCL) take one tab by mouth once daily Prescriptions: LISINOPRIL 5 MG TABS (LISINOPRIL) Take 1 tablet by mouth once a day  #100 x 3   Entered by:   Kern Reap CMA (AAMA)   Authorized by:   Roderick Pee MD   Signed by:   Kern Reap CMA (AAMA) on 07/22/2010   Method used:   Faxed to ...       [facility] (mail-order)             , [place]         Ph: [phone]       Fax: [phone]   RxID:   [id number] METOPROLOL TARTRATE 25 MG TABS (METOPROLOL TARTRATE) 1/2 tab by mouth two times a day  #100 x 3   Entered by:   Kern Reap CMA (AAMA)   Authorized by:   Roderick Pee MD   Signed by:   Kern Reap CMA (AAMA) on 07/22/2010   Method used:   Faxed to ...       [facility] (mail-order)             , [place]         Ph: [phone]       Fax: [phone]   RxID:   [id number] AMIODARONE HCL 200 MG TABS (AMIODARONE HCL) take one tab by mouth once daily  #90 x 3   Entered by:   Kern Reap CMA (AAMA)   Authorized by:   Roderick Pee MD   Signed by:   Kern Reap CMA (AAMA) on 07/22/2010   Method used:   Faxed to ...       [facility] (mail-order)             , [place]         Ph: [phone]       Fax: [phone]   RxID:   [id number]

## 2011-01-26 NOTE — Letter (Signed)
Summary: Cardiac Catheterization Instructions- [facility]  [facility], Main Office  [address]   [place], [place] [address]   Phone: [phone]  Fax: [phone]     10/23/2010 MRN: [id number]  Alexander Payne [address] [place], [place]  [address]  Dear [name],   You are scheduled for a Cardiac Catheterization on [facility] 11-02-10 with Dr. Eden Emms  Please arrive to the 1st floor of the Heart and Vascular Center at [facility] at     7:30 am       on the day of your procedure. Please do not arrive before 6:30 a.m. Call the Heart and Vascular Center at [phone] if you are unable to make your appointmnet. The Code to get into the parking garage under the building is  2000. Take the elevators to the 1st floor. You must have someone to drive you home. Someone must be with you for the first 24 hours after you arrive home. Please wear clothes that are easy to get on and off and wear slip-on shoes. Do not eat or drink after midnight except water with your medications that morning. Bring all your medications and current insurance cards with you.  ___ DO NOT take these medications before your procedure: ________________________________________________________________  ___ Make sure you take your aspirin.  _XX__ You may take ALL of your medications with water that morning. ________________________________________________________________________________________________________________________________  ___ DO NOT take ANY medications before your procedure.  ___ Pre-med instructions:  ________________________________________________________________________________________________________________________________  The usual length of stay after your procedure is 2 to 3 hours. This can vary.  If you have any questions, please call the office at the number listed above.   Deliah Goody, RN

## 2011-01-26 NOTE — Assessment & Plan Note (Signed)
Summary: F6M/DM   History of Present Illness: Alexander Payne is following up today for severe aortic stenosis.  His last echo October 2010 showed a mean gradient of 36 mm mercury the peak gradient of 61 mmHg.  Septal thickness is 16.5 mm.  His ejection fraction was normal at 55-65%.  He had mild aortic regurgitation.  He had a Myoview study October 22 of a non-is nonischemic.  I questioned him quite closely and he truly is asymptomatic in regards to not having any significant chest pain palpitations or presyncope.  There's been no exertional dyspnea.  I spent a long time going over the indications for valve surgery with Molly Maduro.  However at 46 and being asymptomatic I think it is appropriate to not rushed to surgery.  If we can given through the next few years he may be a candidate for tissue valve.  Certainly if he needed mechanical valve he has a much better chance at having to have one surgery if we can get him into his early 2s.  He understands warning signs.  We have screened  him for coronary artery disease.  He continues to work a full job at [employer].  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  He is fairly sedentary but has no problems doing work around the house and walking.  I told him he needs a followup echo.  Clearly if his LV function is changed or  gradients of markedly increased we may rethink things in regards to his valve.  He also knows the importance of keeping his dentition in good shape.  Current Problems (verified): 1)  Electrocardiogram, Abnormal  (ICD-794.31) 2)  Bicuspid Aortic Valve  (ICD-746.4) 3)  Paroxysmal Atrial Fibrillation  (ICD-427.31) 4)  Hypertension  (ICD-401.9) 5)  Aortic Stenosis  (ICD-424.1)  Current Medications (verified): 1)  Lisinopril 5 Mg Tabs (Lisinopril) .... Take 1 Tablet By Mouth Once A Day 2)  Metoprolol Tartrate 25 Mg Tabs (Metoprolol Tartrate) .... 1/2 Tab By Mouth Two Times A Day 3)  Aspirin 325 Mg  Tabs (Aspirin) .Marland Kitchen.. 1 Tab By Mouth Once Daily 4)  Fish Oil    Oil (Fish Oil) .Marland Kitchen.. 1 Tab By Mouth Once Daily 5)  Multivitamins   Tabs (Multiple Vitamin) .Marland Kitchen.. 1 Podaily 6)  Krill Oil 1000 Mg Caps (Krill Oil) .Marland Kitchen.. 1 By Mouth Daily  Allergies (verified): No Known Drug Allergies  Past History:  Past Medical History: Last updated: 04/09/2009 Hypertension AS bicuspid valve PAF Hypertension  Past Surgical History: Last updated: 04/08/2009  Significant for coronary disease.   Family History: Last updated: 04/09/2009 non-contributory  Social History: Last updated: 04/09/2009 Works at [employer] Non-smoker Non-drinker Sedentary  Review of Systems       Denies fever, malais, weight loss, blurry vision, decreased visual acuity, cough, sputum, SOB, hemoptysis, pleuritic pain, palpitaitons, heartburn, abdominal pain, melena, lower extremity edema, claudication, or rash.   Vital Signs:  Patient profile:   60 year old male Height:      75 inches Weight:      215 pounds BMI:     26.97 Pulse rate:   65 / minute Resp:     18 per minute BP sitting:   124 / 69  (right arm)  Vitals Entered By: Marrion Coy, CNA (April 14, 2010 8:10 AM)  Physical Exam  General:  Affect appropriate Healthy:  appears stated age HEENT: normal Neck supple with no adenopathy JVP normal no bruits no thyromegaly Lungs clear with no wheezing and good  diaphragmatic motion Heart:  S1/S2 diminished with  AS murmur, no rub, gallop or click PMI normal Abdomen: benighn, BS positve, no tenderness, no AAA no bruit.  No HSM or HJR Distal pulses intact with no bruits No edema Neuro non-focal Skin warm and dry    Impression & Recommendations:  Problem # 1:  ELECTROCARDIOGRAM, ABNORMAL (ICD-794.31) Likely from LVH compared to 1010 no change The following medications were removed from the medication list:    Amiodarone Hcl 200 Mg Tabs (Amiodarone hcl) .Marland Kitchen... Take 1 tablet by mouth once a day His updated medication list for this problem includes:     Lisinopril 5 Mg Tabs (Lisinopril) .Marland Kitchen... Take 1 tablet by mouth once a day    Metoprolol Tartrate 25 Mg Tabs (Metoprolol tartrate) .Marland Kitchen... 1/2 tab by mouth two times a day    Aspirin 325 Mg Tabs (Aspirin) .Marland Kitchen... 1 tab by mouth once daily  Problem # 2:  BICUSPID AORTIC VALVE (ICD-746.4) Asymptomatic severe AS.  Normal stress test last year.  F/U echo in 6 months The following medications were removed from the medication list:    Amiodarone Hcl 200 Mg Tabs (Amiodarone hcl) .Marland Kitchen... Take 1 tablet by mouth once a day His updated medication list for this problem includes:    Lisinopril 5 Mg Tabs (Lisinopril) .Marland Kitchen... Take 1 tablet by mouth once a day    Metoprolol Tartrate 25 Mg Tabs (Metoprolol tartrate) .Marland Kitchen... 1/2 tab by mouth two times a day    Aspirin 325 Mg Tabs (Aspirin) .Marland Kitchen... 1 tab by mouth once daily  Problem # 3:  HYPERTENSION (ICD-401.9) Well controlled.  Do not increase lisinopril due to fixed after load with valve disease His updated medication list for this problem includes:    Lisinopril 5 Mg Tabs (Lisinopril) .Marland Kitchen... Take 1 tablet by mouth once a day    Metoprolol Tartrate 25 Mg Tabs (Metoprolol tartrate) .Marland Kitchen... 1/2 tab by mouth two times a day    Aspirin 325 Mg Tabs (Aspirin) .Marland Kitchen... 1 tab by mouth once daily  Patient Instructions: 1)  Your physician has requested that you have an echocardiogram.  Echocardiography is a painless test that uses sound waves to create images of your heart. It provides your doctor with information about the size and shape of your heart and how well your heart's chambers and valves are working.  This procedure takes approximately one hour. There are no restrictions for this procedure. IN 6 MONTHS--See Dr Eden Emms the same day. 2)  Your physician wants you to follow-up in: 6 months with Dr Eden Emms.  You will receive a reminder letter in the mail two months in advance. If you don't receive a letter, please call our office to schedule the follow-up appointment. HAVE AN ECHO THE  SAME DAY YOU SEE DR Eden Emms   EKG Report  Procedure date:  04/14/2010  Findings:      NSR 65 LVH with strain No change from 10/10  Appended Document: F6M/DM    Clinical Lists Changes  Orders: Added new Referral order of Echocardiogram (Echo) - Signed

## 2011-01-28 NOTE — Medication Information (Signed)
Summary: rov  Anticoagulant Therapy  Managed by: Weston Brass, PharmD Referring MD: Patience Musca MD: Shirlee Latch MD, Freida Busman Indication 1: Aortic Valve Replacement Indication 2: Atrial Fibrillation Valve Type: [facility] -- Mechanical Lab Used: [facility] [place] Site: [address] INR POC 2.0 INR RANGE 2.5-3.0  Dietary changes: no    Health status changes: no    Bleeding/hemorrhagic complications: no    Recent/future hospitalizations: no    Any changes in medication regimen? no    Recent/future dental: no  Any missed doses?: no       Is patient compliant with meds? yes       Allergies: No Known Drug Allergies  Anticoagulation Management History:      The patient is taking warfarin and comes in today for a routine follow up visit.  Negative risk factors for bleeding include an age less than 10 years old.  The bleeding index is 'low risk'.  Positive CHADS2 values include History of HTN.  Negative CHADS2 values include Age > 83 years old.  His last INR was 1.0 ratio.  Anticoagulation responsible provider: Shirlee Latch MD, Angelus Hoopes.  INR POC: 2.0.  Cuvette Lot#: [id number].  Exp: 12/2011.    Anticoagulation Management Assessment/Plan:      The target INR is 2.5-3.0.  The next INR is due 01/29/2011.  Anticoagulation instructions were given to patient.  Results were reviewed/authorized by Weston Brass, PharmD.  He was notified by Stephannie Peters, PharmD Candidate .         Prior Anticoagulation Instructions: INR 3.1 Skip Saturdays dose then change dose to 2.5mg s everyday except 1mg s on Mondays. Recheck in one week.   Samples given # 4 tabs Lot # [id number] 01/2011  Current Anticoagulation Instructions: INR 2.0  Coumadin 5 mg tablets - Take 1 whole tablet today, then take 1/2 tablet every day

## 2011-01-28 NOTE — Op Note (Signed)
Summary: [facility]  [facility]   Imported By: Marylou Mccoy 12/23/2010 17:43:55  _____________________________________________________________________  External Attachment:    Type:   Image     Comment:   External Document

## 2011-01-28 NOTE — Medication Information (Signed)
Summary: new to coumadin/sp  Anticoagulant Therapy  Managed by: Weston Brass, PharmD Referring MD: Patience Musca MD: Graciela Husbands MD, Viviann Spare Indication 1: Aortic Valve Replacement Indication 2: Atrial Fibrillation Valve Type: [facility] -- Mechanical Lab Used: [facility] [place] Site: [address] INR POC 5.0 INR RANGE 2.5-3.0     Recent/future hospitalizations: yes       Details: Pt admitted from 12/9-12/13 for AVR with atrial fibrillation post op  Any changes in medication regimen? yes       Details: pt has been on amiodarone prior to this hospitalization     Comments: Pt and wife educated on bleeding risks, dietary concerns and medication interactions.  Pt received 5mg  x 3 days and 7.5mg  x 2 days in hospital.  INR on 12/13 was 2.25.  Discharged on 5mg  daily.  Per Dr. Eden Emms- INR range 2.5-3.0  Allergies: No Known Drug Allergies  Anticoagulation Management History:      The patient is taking warfarin and comes in today for a routine follow up visit.  Negative risk factors for bleeding include an age less than 42 years old.  The bleeding index is 'low risk'.  Positive CHADS2 values include History of HTN.  Negative CHADS2 values include Age > 81 years old.  His last INR was 1.0 ratio.  Anticoagulation responsible provider: Graciela Husbands MD, Viviann Spare.  INR POC: 5.0.  Cuvette Lot#: [id number].  Exp: 01/2012.    Anticoagulation Management Assessment/Plan:      The target INR is 2.5-3.0.  The next INR is due 12/17/2010.  Anticoagulation instructions were given to patient.  Results were reviewed/authorized by Weston Brass, PharmD.  He was notified by Weston Brass PharmD.         Current Anticoagulation Instructions: INR 5.0  Skip today's dose of Coumadin then decrease dose to 1/2 tablet every day except 1 tablet on Monday, Wednesday and Friday.  Recheck INR in 1 week.

## 2011-01-28 NOTE — Letter (Signed)
Summary: [facility]  [facility]   Imported By: Roderic Ovens 12/25/2010 14:38:13  _____________________________________________________________________  External Attachment:    Type:   Image     Comment:   External Document

## 2011-01-28 NOTE — Medication Information (Signed)
Summary: rov/tm  Anticoagulant Therapy  Managed by: Weston Brass, PharmD Referring MD: Patience Musca MD: Tenny Craw MD, Gunnar Fusi Indication 1: Aortic Valve Replacement Indication 2: Atrial Fibrillation Valve Type: [facility] -- Mechanical Lab Used: [facility] [place] Site: [address] INR POC 3.0 INR RANGE 2.5-3.0  Dietary changes: no    Health status changes: no    Bleeding/hemorrhagic complications: no    Recent/future hospitalizations: no    Any changes in medication regimen? no    Recent/future dental: no  Any missed doses?: no       Is patient compliant with meds? yes       Allergies: No Known Drug Allergies  Anticoagulation Management History:      The patient is taking warfarin and comes in today for a routine follow up visit.  Negative risk factors for bleeding include an age less than 52 years old.  The bleeding index is 'low risk'.  Positive CHADS2 values include History of HTN.  Negative CHADS2 values include Age > 59 years old.  His last INR was 1.0 ratio.  Anticoagulation responsible provider: Tenny Craw MD, Gunnar Fusi.  INR POC: 3.0.  Cuvette Lot#: [id number].  Exp: 01/2012.    Anticoagulation Management Assessment/Plan:      The target INR is 2.5-3.0.  The next INR is due 01/15/2011.  Anticoagulation instructions were given to patient.  Results were reviewed/authorized by Weston Brass, PharmD.  He was notified by Linward Headland, PharmD candidate.         Prior Anticoagulation Instructions: INR 3.1 Skip Saturdays dose then change dose to 2.5mg s everyday except 1mg s on Mondays. Recheck in one week.   Samples given # 4 tabs Lot # [id number] 01/2011  Current Anticoagulation Instructions: INR 3.0 (INR range: 2.5-3.5)  Take 1/2 5 mg  tablet on Sundays, Tuesdays, Wednesdays, Thursdays, Fridays, and Saturdays and 1 mg tablet on Mondays.

## 2011-01-28 NOTE — Miscellaneous (Signed)
Summary: [place] Physician Order/Treatment Plan   [facility] Physician Order/Treatment Plan   Imported By: Roderic Ovens 12/22/2010 16:35:37  _____________________________________________________________________  External Attachment:    Type:   Image     Comment:   External Document

## 2011-01-28 NOTE — Progress Notes (Signed)
Summary: Pt request call  Phone Note Call from Patient Call back at Home Phone [phone]   Caller: Patient Reason for Call: Talk to Nurse Initial call taken by: Judie Grieve,  December 22, 2010 9:41 AM  Follow-up for Phone Call        spoke with pt, he is statis post valve replacement. he has an appt to see dr Eden Emms 01-21-11. he wants to make sure that is soon enough. he denies any problems or concerns. will foward for dr Eden Emms review Deliah Goody, RN  December 22, 2010 12:34 PM   Additional Follow-up for Phone Call Additional follow up Details #1::        Should have F/U echo next week and if no pericardial effusion ok to see me on 1/26 Additional Follow-up by: Colon Branch, MD, [facility],  December 23, 2010 5:39 PM     Appended Document: Orders Update pt aware, echo scheduled Deliah Goody, RN  December 24, 2010 4:57 PM    Clinical Lists Changes  Orders: Added new Referral order of Echocardiogram (Echo) - Signed

## 2011-01-28 NOTE — Medication Information (Signed)
Summary: rov/sp  Anticoagulant Therapy  Managed by: Samantha Crimes, PharmD Referring MD: Patience Musca MD: Riley Kill MD, Maisie Fus Indication 1: Aortic Valve Replacement Indication 2: Atrial Fibrillation Valve Type: [facility]es -- Mechanical Lab Used: [facility] [place] Site: [address] INR POC 2.3 INR RANGE 2.5-3.0  Dietary changes: no    Health status changes: yes       Details: aortic valve procedure this past week  Bleeding/hemorrhagic complications: no    Recent/future hospitalizations: yes       Details: yes this past week  Any changes in medication regimen? yes       Details: added digoxin 0.25 daily, doubled amiodarone, d/c asa/lisinopril  Recent/future dental: no  Any missed doses?: no       Is patient compliant with meds? yes      Comments: started taking medications on 12/21 after discharged from hospital. Received 1 unit transfusion on 12/20.  Current Medications (verified): 1)  Amiodarone Hcl 200 Mg Tabs (Amiodarone Hcl) .... Take Two Tab By Mouth Once Daily 2)  Metoprolol Tartrate 25 Mg Tabs (Metoprolol Tartrate) .... 1/2 Tab By Mouth Two Times A Day 3)  Fish Oil   Oil (Fish Oil) .Marland Kitchen.. 1 Tab By Mouth Once Daily 4)  Multivitamins   Tabs (Multiple Vitamin) .Marland Kitchen.. 1 Podaily 5)  Digoxin 0.25 Mg Tabs (Digoxin) .... Take One Daily  Allergies (verified): No Known Drug Allergies  Anticoagulation Management History:      Negative risk factors for bleeding include an age less than 45 years old.  The bleeding index is 'low risk'.  Positive CHADS2 values include History of HTN.  Negative CHADS2 values include Age > 71 years old.  His last INR was 1.0 ratio.  Anticoagulation responsible provider: Riley Kill MD, Maisie Fus.  INR POC: 2.3.  Exp: 01/2012.    Anticoagulation Management Assessment/Plan:      The target INR is 2.5-3.0.  The next INR is due 12/25/2010.  Anticoagulation instructions were given to patient.  Results were reviewed/authorized by Samantha Crimes,  PharmD.         Prior Anticoagulation Instructions: INR 5.0  Skip today's dose of Coumadin then decrease dose to 1/2 tablet every day except 1 tablet on Monday, Wednesday and Friday.  Recheck INR in 1 week.   Current Anticoagulation Instructions: Cont current regimen of 2.5 mg Warfarin daily Return to clinic on Dec 30th, at 0915 am

## 2011-01-28 NOTE — Medication Information (Signed)
Summary: Alexander Payne  Anticoagulant Therapy  Managed by: Bethena Midget, RN, BSN Referring MD: Patience Musca MD: Shirlee Latch MD, [place] Indication 1: Aortic Valve Replacement Indication 2: Atrial Fibrillation Valve Type: [facility] Judes -- Mechanical Lab Used: [facility] [place] Site: [address] INR POC 3.1 INR RANGE 2.5-3.0  Dietary changes: no    Health status changes: no    Bleeding/hemorrhagic complications: no    Recent/future hospitalizations: no    Any changes in medication regimen? no    Recent/future dental: no  Any missed doses?: no       Is patient compliant with meds? yes       Allergies: No Known Drug Allergies  Anticoagulation Management History:      The patient is taking warfarin and comes in today for a routine follow up visit.  Negative risk factors for bleeding include an age less than 110 years old.  The bleeding index is 'low risk'.  Positive CHADS2 values include History of HTN.  Negative CHADS2 values include Age > 18 years old.  His last INR was 1.0 ratio.  Anticoagulation responsible provider: Shirlee Latch MD, [place].  INR POC: 3.1.  Cuvette Lot#: [id number].  Exp: 01/2012.    Anticoagulation Management Assessment/Plan:      The target INR is 2.5-3.0.  The next INR is due 01/01/2011.  Anticoagulation instructions were given to patient.  Results were reviewed/authorized by Bethena Midget, RN, BSN.  He was notified by Bethena Midget, RN, BSN.         Prior Anticoagulation Instructions: Cont current regimen of 2.5 mg Warfarin daily Return to clinic on Dec 30th, at 0915 am  Current Anticoagulation Instructions: INR 3.1 Skip Saturdays dose then change dose to 2.5mg s everyday except 1mg s on Mondays. Recheck in one week.   Samples given # 4 tabs Lot # [id number] Exp 01/2011

## 2011-01-28 NOTE — Assessment & Plan Note (Signed)
Summary: F2M/DM   CC:  check up.  History of Present Illness: Alexander Payne returns today for F/U S/P AVR with post op PAF resolved.  Subsequent tamponade with need for pericardial windiow  F/U echo 1/3 no effusion normal AVR.  No SOB palpitations, or chest pain.  Coumadin dose still beign adjusted.  Has 'cold" sensation possibly from anemia.  No edema PND or orthopnea.  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 Take Two Tab By Mouth Once Daily 2)  Metoprolol Tartrate 25 Mg Tabs (Metoprolol Tartrate) .... 1/2 Tab By Mouth Two Times A Day 3)  Fish Oil   Oil (Fish Oil) .Marland Kitchen.. 1 Tab By Mouth Once Daily 4)  Multivitamins   Tabs (Multiple Vitamin) .Marland Kitchen.. 1 Podaily 5)  Digoxin 0.25 Mg Tabs (Digoxin) .... Take One Daily 6)  Coumadin 2.5 Mg Tabs (Warfarin Sodium) .... As Directed  Allergies (verified): No Known Drug Allergies  Past History:  Past Medical History: Last updated: 04/09/2009 Hypertension AS bicuspid valve PAF Hypertension  Past Surgical History: Last updated: 04/08/2009  Significant for coronary disease.   Family History: Last updated: 04/09/2009 non-contributory  Social History: Last updated: 04/09/2009 Works at [employer] Non-smoker Non-drinker Sedentary  Review of Systems       Denies fever, malais, weight loss, blurry vision, decreased visual acuity, cough, sputum, SOB, hemoptysis, pleuritic pain, palpitaitons, heartburn, abdominal pain, melena, lower extremity edema, claudication, or rash.   Vital Signs:  Patient profile:   60 year old male Height:       75 inches Weight:      199 pounds BMI:     24.96 Pulse rate:   68 / minute Resp:     14 per minute BP sitting:   120 / 80  (left arm)  Vitals Entered By: Kem Parkinson (January 21, 2011 9:20 AM)  Physical Exam  General:  Affect appropriate Healthy:  appears stated age HEENT: normal Neck supple with no adenopathy JVP normal no bruits no thyromegaly Lungs clear with no wheezing and good diaphragmatic motion Heart:  S1/S2click SEM  no AR  murmur,rub, gallop or click PMI normal Abdomen: benighn, BS positve, no tenderness, no AAA no bruit.  No HSM or HJR Distal pulses intact with no bruits No edema Neuro non-focal Skin warm and dry    Impression & Recommendations:  Problem # 1:  BICUSPID AORTIC VALVE (ICD-746.4) S/P AVR  SBE prophylxis post op echo normal AVR no AR His updated medication list for this problem includes:    Amiodarone Hcl 200 Mg Tabs (Amiodarone hcl) .Marland Kitchen... Take two tab by mouth once daily    Metoprolol Tartrate 25 Mg Tabs (Metoprolol tartrate) .Marland Kitchen... 1/2 tab by mouth two times a day    Coumadin 2.5 Mg Tabs (Warfarin sodium) .Marland Kitchen... As directed  Problem # 2:  PAROXYSMAL ATRIAL FIBRILLATION (ICD-427.31) Will stop dig and amiodarone next visit.  Maint NSR His updated medication list for this problem includes:    Amiodarone Hcl 200 Mg Tabs (Amiodarone hcl) .Marland Kitchen... Take two tab by mouth once daily    Metoprolol Tartrate 25 Mg Tabs (Metoprolol tartrate) .Marland Kitchen... 1/2 tab by mouth  two times a day    Digoxin 0.25 Mg Tabs (Digoxin) .Marland Kitchen... Take one daily    Coumadin 2.5 Mg Tabs (Warfarin sodium) .Marland Kitchen... As directed  Problem # 3:  HYPERTENSION (ICD-401.9) Well controlled His updated medication list for this problem includes:    Metoprolol Tartrate 25 Mg Tabs (Metoprolol tartrate) .Marland Kitchen... 1/2 tab by mouth two times a day

## 2011-01-28 NOTE — Progress Notes (Signed)
Summary: [facility] re meds  Phone Note From Pharmacy   Caller: [facility]/bell brown Request: Needs authorization from insurer Summary of Call: warfarin 5mg  [phone] ref# [id number] [facility] needs it by wednesday 12/30/2010 by 2pm.  Initial call taken by: Roe Coombs,  December 25, 2010 3:09 PM  Follow-up for Phone Call        please take care of  Follow-up by: Colon Branch, MD, [facility],  December 29, 2010 8:31 AM  Additional Follow-up for Phone Call Additional follow up Details #1::        Called [facility] did not need insurance auth for Warfarin! Calling because rx for 5mg  tablets of Warfarin was cancelled, advised [facility] we are currently titrating his dosage and we are unsure what strength of Warfarin he will need, so we do not wish to rx Warfarin through them at the present.   Additional Follow-up by: Cloyde Reams, RN, BSN,  December 29, 2010 11:12 AM

## 2011-01-29 ENCOUNTER — Ambulatory Visit: Admit: 2011-01-29 | Payer: Self-pay

## 2011-01-29 ENCOUNTER — Encounter: Payer: Self-pay | Admitting: Cardiology

## 2011-01-29 ENCOUNTER — Encounter (INDEPENDENT_AMBULATORY_CARE_PROVIDER_SITE_OTHER): Payer: BC Managed Care – PPO

## 2011-01-29 DIAGNOSIS — Z7901 Long term (current) use of anticoagulants: Secondary | ICD-10-CM

## 2011-01-29 DIAGNOSIS — I4891 Unspecified atrial fibrillation: Secondary | ICD-10-CM

## 2011-02-01 ENCOUNTER — Telehealth: Payer: Self-pay | Admitting: Cardiovascular Disease

## 2011-02-03 NOTE — Letter (Signed)
Summary: Discharge Summary  Discharge Summary   Imported By: Earl Many 01/25/2011 18:40:17  _____________________________________________________________________  External Attachment:    Type:   Image     Comment:   External Document

## 2011-02-03 NOTE — Medication Information (Signed)
Summary: CCR/TMJ  Anticoagulant Therapy  Managed by: Windell Hummingbird, RN Referring MD: Patience Musca MD: Jens Som MD, Arlys John Indication 1: Aortic Valve Replacement Indication 2: Atrial Fibrillation Valve Type: [facility] -- Mechanical Lab Used: [facility] [place] Site: [address] INR POC 2.9 INR RANGE 2.5-3.0  Dietary changes: no    Health status changes: no    Bleeding/hemorrhagic complications: no    Recent/future hospitalizations: no    Any changes in medication regimen? no    Recent/future dental: no  Any missed doses?: no       Is patient compliant with meds? yes       Allergies: No Known Drug Allergies  Anticoagulation Management History:      The patient is taking warfarin and comes in today for a routine follow up visit.  Negative risk factors for bleeding include an age less than 77 years old.  The bleeding index is 'low risk'.  Positive CHADS2 values include History of HTN.  Negative CHADS2 values include Age > 76 years old.  His last INR was 1.0 ratio.  Anticoagulation responsible provider: Jens Som MD, Arlys John.  INR POC: 2.9.  Cuvette Lot#: [id number].  Exp: 01/2012.    Anticoagulation Management Assessment/Plan:      The patient's current anticoagulation dose is Coumadin 2.5 mg tabs: as directed.  The target INR is 2.5-3.0.  The next INR is due 02/19/2011.  Anticoagulation instructions were given to patient.  Results were reviewed/authorized by Windell Hummingbird, RN.  He was notified by Windell Hummingbird, RN.         Prior Anticoagulation Instructions: INR 2.0  Coumadin 5 mg tablets - Take 1 whole tablet today, then take 1/2 tablet every day   Current Anticoagulation Instructions: INR 2.9 Continue taking 1/2 tablet every day. Recheck in 3 weeks.

## 2011-02-11 NOTE — Progress Notes (Signed)
Summary: rx refill  Phone Note Refill Request Message from:  Patient on February 01, 2011 3:52 PM  Refills Requested: Medication #1:  AMIODARONE HCL 200 MG TABS take two tab by mouth once daily  Medication #2:  COUMADIN 2.5 MG TABS as directed. [facility] fax # [phone] option 2    Method Requested: Telephone to Pharmacy Initial call taken by: Roe Coombs,  February 01, 2011 3:53 PM    Prescriptions: COUMADIN 2.5 MG TABS (WARFARIN SODIUM) as directed  #90 x 1   Entered by:   Cloyde Reams RN   Authorized by:   Colon Branch, MD, [facility]   Signed by:   Cloyde Reams RN on 02/02/2011   Method used:   Faxed to ...       [facility] (mail-order)             , [place]         Ph: [phone]       Fax: [phone]   RxID:   [id number] AMIODARONE HCL 200 MG TABS (AMIODARONE HCL) take two tab by mouth once daily  #180 x 3   Entered by:   Kem Parkinson   Authorized by:   Colon Branch, MD, [facility]   Signed by:   Kem Parkinson on 02/01/2011   Method used:   Faxed to ...       [facility] (mail-order)             , [place]         Ph: [phone]       Fax: [phone]   RxID:   [id number]

## 2011-02-18 DIAGNOSIS — Q231 Congenital insufficiency of aortic valve: Secondary | ICD-10-CM

## 2011-02-18 DIAGNOSIS — I4891 Unspecified atrial fibrillation: Secondary | ICD-10-CM

## 2011-02-19 ENCOUNTER — Encounter (INDEPENDENT_AMBULATORY_CARE_PROVIDER_SITE_OTHER): Payer: BC Managed Care – PPO

## 2011-02-19 ENCOUNTER — Encounter: Payer: Self-pay | Admitting: Cardiology

## 2011-02-19 DIAGNOSIS — I359 Nonrheumatic aortic valve disorder, unspecified: Secondary | ICD-10-CM

## 2011-02-19 DIAGNOSIS — Z7901 Long term (current) use of anticoagulants: Secondary | ICD-10-CM

## 2011-02-19 DIAGNOSIS — I4891 Unspecified atrial fibrillation: Secondary | ICD-10-CM

## 2011-02-19 LAB — CONVERTED CEMR LAB: POC INR: 2.1

## 2011-02-23 NOTE — Medication Information (Signed)
Summary: rov/tm  Anticoagulant Therapy  Managed by: Windell Hummingbird, RN Referring MD: Patience Musca MD: Jens Som MD, Arlys John Indication 1: Aortic Valve Replacement Indication 2: Atrial Fibrillation Valve Type: [facility]es -- Mechanical Lab Used: [facility] [place] Site: [address] INR POC 2.1 INR RANGE 2.5-3.0  Dietary changes: no    Health status changes: no    Bleeding/hemorrhagic complications: no    Recent/future hospitalizations: no    Any changes in medication regimen? no    Recent/future dental: no  Any missed doses?: no       Is patient compliant with meds? yes       Allergies: No Known Drug Allergies  Anticoagulation Management History:      The patient is taking warfarin and comes in today for a routine follow up visit.  Negative risk factors for bleeding include an age less than 62 years old.  The bleeding index is 'low risk'.  Positive CHADS2 values include History of HTN.  Negative CHADS2 values include Age > 30 years old.  His last INR was 1.0 ratio.  Anticoagulation responsible provider: Jens Som MD, Arlys John.  INR POC: 2.1.  Cuvette Lot#: [id number].  Exp: 12/2011.    Anticoagulation Management Assessment/Plan:      The patient's current anticoagulation dose is Coumadin 2.5 mg tabs: as directed.  The target INR is 2.5-3.0.  The next INR is due 03/12/2011.  Anticoagulation instructions were given to patient.  Results were reviewed/authorized by Windell Hummingbird, RN.  He was notified by Windell Hummingbird, RN.         Prior Anticoagulation Instructions: INR 2.9 Continue taking 1/2 tablet every day. Recheck in 3 weeks.  Current Anticoagulation Instructions: INR 2.1 Take 1 tablet today (5mg  total).  Then resume taking 1/2 tablet every day. Recheck in 3 weeks.

## 2011-03-08 LAB — POCT I-STAT 3, ART BLOOD GAS (G3+)
Acid-Base Excess: 1 mmol/L (ref 0.0–2.0)
Acid-base deficit: 2 mmol/L (ref 0.0–2.0)
Acid-base deficit: 4 mmol/L — ABNORMAL HIGH (ref 0.0–2.0)
Bicarbonate: 21.2 mEq/L (ref 20.0–24.0)
Bicarbonate: 21.5 mEq/L (ref 20.0–24.0)
Bicarbonate: 25.8 mEq/L — ABNORMAL HIGH (ref 20.0–24.0)
Bicarbonate: 25.9 mEq/L — ABNORMAL HIGH (ref 20.0–24.0)
O2 Saturation: 100 %
O2 Saturation: 100 %
O2 Saturation: 98 %
O2 Saturation: 99 %
O2 Saturation: 99 %
Patient temperature: 97.3
Patient temperature: 97.3
TCO2: 22 mmol/L (ref 0–100)
TCO2: 23 mmol/L (ref 0–100)
TCO2: 27 mmol/L (ref 0–100)
TCO2: 28 mmol/L (ref 0–100)
pCO2 arterial: 37.8 mmHg (ref 35.0–45.0)
pCO2 arterial: 44.2 mmHg (ref 35.0–45.0)
pCO2 arterial: 47.7 mmHg — ABNORMAL HIGH (ref 35.0–45.0)
pH, Arterial: 7.352 (ref 7.350–7.450)
pH, Arterial: 7.363 (ref 7.350–7.450)
pH, Arterial: 7.557 — ABNORMAL HIGH (ref 7.350–7.450)
pO2, Arterial: 304 mmHg — ABNORMAL HIGH (ref 80.0–100.0)
pO2, Arterial: 345 mmHg — ABNORMAL HIGH (ref 80.0–100.0)

## 2011-03-08 LAB — HEMOGLOBIN A1C
Hgb A1c MFr Bld: 6.1 % — ABNORMAL HIGH (ref <5.7)
Mean Plasma Glucose: 128 mg/dL — ABNORMAL HIGH (ref <117)

## 2011-03-08 LAB — BASIC METABOLIC PANEL
BUN: 10 mg/dL (ref 6–23)
BUN: 11 mg/dL (ref 6–23)
BUN: 12 mg/dL (ref 6–23)
BUN: 15 mg/dL (ref 6–23)
BUN: 20 mg/dL (ref 6–23)
BUN: 28 mg/dL — ABNORMAL HIGH (ref 6–23)
BUN: 8 mg/dL (ref 6–23)
CO2: 23 mEq/L (ref 19–32)
CO2: 25 mEq/L (ref 19–32)
CO2: 26 mEq/L (ref 19–32)
CO2: 26 mEq/L (ref 19–32)
CO2: 27 mEq/L (ref 19–32)
CO2: 28 mEq/L (ref 19–32)
CO2: 29 mEq/L (ref 19–32)
CO2: 29 mEq/L (ref 19–32)
Calcium: 7.6 mg/dL — ABNORMAL LOW (ref 8.4–10.5)
Calcium: 7.6 mg/dL — ABNORMAL LOW (ref 8.4–10.5)
Calcium: 8.2 mg/dL — ABNORMAL LOW (ref 8.4–10.5)
Calcium: 8.2 mg/dL — ABNORMAL LOW (ref 8.4–10.5)
Calcium: 8.2 mg/dL — ABNORMAL LOW (ref 8.4–10.5)
Calcium: 8.4 mg/dL (ref 8.4–10.5)
Calcium: 8.4 mg/dL (ref 8.4–10.5)
Chloride: 102 mEq/L (ref 96–112)
Chloride: 103 mEq/L (ref 96–112)
Chloride: 104 mEq/L (ref 96–112)
Chloride: 106 mEq/L (ref 96–112)
Chloride: 108 mEq/L (ref 96–112)
Creatinine, Ser: 0.85 mg/dL (ref 0.4–1.5)
Creatinine, Ser: 0.88 mg/dL (ref 0.4–1.5)
Creatinine, Ser: 0.9 mg/dL (ref 0.4–1.5)
Creatinine, Ser: 0.93 mg/dL (ref 0.4–1.5)
Creatinine, Ser: 0.94 mg/dL (ref 0.4–1.5)
Creatinine, Ser: 1.2 mg/dL (ref 0.4–1.5)
GFR calc Af Amer: >60 mL/min (ref >60)
GFR calc Af Amer: >60 mL/min (ref >60)
GFR calc Af Amer: >60 mL/min (ref >60)
GFR calc Af Amer: >60 mL/min (ref >60)
GFR calc Af Amer: >60 mL/min (ref >60)
GFR calc Af Amer: >60 mL/min (ref >60)
GFR calc non Af Amer: >60 mL/min (ref >60)
GFR calc non Af Amer: >60 mL/min (ref >60)
GFR calc non Af Amer: >60 mL/min (ref >60)
GFR calc non Af Amer: >60 mL/min (ref >60)
GFR calc non Af Amer: >60 mL/min (ref >60)
GFR calc non Af Amer: >60 mL/min (ref >60)
GFR calc non Af Amer: >60 mL/min (ref >60)
Glucose, Bld: 100 mg/dL — ABNORMAL HIGH (ref 70–99)
Glucose, Bld: 104 mg/dL — ABNORMAL HIGH (ref 70–99)
Glucose, Bld: 106 mg/dL — ABNORMAL HIGH (ref 70–99)
Glucose, Bld: 108 mg/dL — ABNORMAL HIGH (ref 70–99)
Glucose, Bld: 118 mg/dL — ABNORMAL HIGH (ref 70–99)
Glucose, Bld: 125 mg/dL — ABNORMAL HIGH (ref 70–99)
Glucose, Bld: 127 mg/dL — ABNORMAL HIGH (ref 70–99)
Glucose, Bld: 132 mg/dL — ABNORMAL HIGH (ref 70–99)
Potassium: 3.9 mEq/L (ref 3.5–5.1)
Potassium: 4.2 mEq/L (ref 3.5–5.1)
Potassium: 4.2 mEq/L (ref 3.5–5.1)
Potassium: 4.2 mEq/L (ref 3.5–5.1)
Potassium: 4.4 mEq/L (ref 3.5–5.1)
Potassium: 4.5 mEq/L (ref 3.5–5.1)
Sodium: 132 mEq/L — ABNORMAL LOW (ref 135–145)
Sodium: 136 mEq/L (ref 135–145)
Sodium: 136 mEq/L (ref 135–145)
Sodium: 136 mEq/L (ref 135–145)
Sodium: 137 mEq/L (ref 135–145)
Sodium: 138 mEq/L (ref 135–145)

## 2011-03-08 LAB — BLOOD GAS, ARTERIAL
Acid-Base Excess: 2.7 mmol/L — ABNORMAL HIGH (ref 0.0–2.0)
Drawn by: 206361
FIO2: 0.21 %
pCO2 arterial: 40.4 mmHg (ref 35.0–45.0)
pH, Arterial: 7.435 (ref 7.350–7.450)
pO2, Arterial: 75.4 mmHg — ABNORMAL LOW (ref 80.0–100.0)

## 2011-03-08 LAB — GLUCOSE, CAPILLARY
Glucose-Capillary: 104 mg/dL — ABNORMAL HIGH (ref 70–99)
Glucose-Capillary: 107 mg/dL — ABNORMAL HIGH (ref 70–99)
Glucose-Capillary: 107 mg/dL — ABNORMAL HIGH (ref 70–99)
Glucose-Capillary: 111 mg/dL — ABNORMAL HIGH (ref 70–99)
Glucose-Capillary: 112 mg/dL — ABNORMAL HIGH (ref 70–99)
Glucose-Capillary: 114 mg/dL — ABNORMAL HIGH (ref 70–99)
Glucose-Capillary: 115 mg/dL — ABNORMAL HIGH (ref 70–99)
Glucose-Capillary: 118 mg/dL — ABNORMAL HIGH (ref 70–99)
Glucose-Capillary: 124 mg/dL — ABNORMAL HIGH (ref 70–99)
Glucose-Capillary: 126 mg/dL — ABNORMAL HIGH (ref 70–99)
Glucose-Capillary: 136 mg/dL — ABNORMAL HIGH (ref 70–99)
Glucose-Capillary: 144 mg/dL — ABNORMAL HIGH (ref 70–99)
Glucose-Capillary: 154 mg/dL — ABNORMAL HIGH (ref 70–99)
Glucose-Capillary: 176 mg/dL — ABNORMAL HIGH (ref 70–99)
Glucose-Capillary: 85 mg/dL (ref 70–99)

## 2011-03-08 LAB — POCT I-STAT 4, (NA,K, GLUC, HGB,HCT)
Glucose, Bld: 98 mg/dL (ref 70–99)
HCT: 27 % — ABNORMAL LOW (ref 39.0–52.0)
HCT: 32 % — ABNORMAL LOW (ref 39.0–52.0)
HCT: 38 % — ABNORMAL LOW (ref 39.0–52.0)
Hemoglobin: 10.2 g/dL — ABNORMAL LOW (ref 13.0–17.0)
Hemoglobin: 10.9 g/dL — ABNORMAL LOW (ref 13.0–17.0)
Hemoglobin: 12.9 g/dL — ABNORMAL LOW (ref 13.0–17.0)
Potassium: 3.8 mEq/L (ref 3.5–5.1)
Potassium: 4.2 mEq/L (ref 3.5–5.1)
Potassium: 4.2 mEq/L (ref 3.5–5.1)
Potassium: 4.9 mEq/L (ref 3.5–5.1)
Potassium: 6 mEq/L — ABNORMAL HIGH (ref 3.5–5.1)
Sodium: 130 mEq/L — ABNORMAL LOW (ref 135–145)
Sodium: 132 mEq/L — ABNORMAL LOW (ref 135–145)
Sodium: 136 mEq/L (ref 135–145)
Sodium: 140 mEq/L (ref 135–145)

## 2011-03-08 LAB — CBC
HCT: 24 % — ABNORMAL LOW (ref 39.0–52.0)
HCT: 24.2 % — ABNORMAL LOW (ref 39.0–52.0)
HCT: 24.4 % — ABNORMAL LOW (ref 39.0–52.0)
HCT: 24.6 % — ABNORMAL LOW (ref 39.0–52.0)
HCT: 24.9 % — ABNORMAL LOW (ref 39.0–52.0)
HCT: 24.9 % — ABNORMAL LOW (ref 39.0–52.0)
HCT: 27 % — ABNORMAL LOW (ref 39.0–52.0)
HCT: 31.7 % — ABNORMAL LOW (ref 39.0–52.0)
HCT: 32.4 % — ABNORMAL LOW (ref 39.0–52.0)
Hemoglobin: 10.8 g/dL — ABNORMAL LOW (ref 13.0–17.0)
Hemoglobin: 11 g/dL — ABNORMAL LOW (ref 13.0–17.0)
Hemoglobin: 11.1 g/dL — ABNORMAL LOW (ref 13.0–17.0)
Hemoglobin: 8.1 g/dL — ABNORMAL LOW (ref 13.0–17.0)
Hemoglobin: 8.3 g/dL — ABNORMAL LOW (ref 13.0–17.0)
Hemoglobin: 8.4 g/dL — ABNORMAL LOW (ref 13.0–17.0)
Hemoglobin: 8.4 g/dL — ABNORMAL LOW (ref 13.0–17.0)
Hemoglobin: 8.4 g/dL — ABNORMAL LOW (ref 13.0–17.0)
Hemoglobin: 8.7 g/dL — ABNORMAL LOW (ref 13.0–17.0)
Hemoglobin: 9 g/dL — ABNORMAL LOW (ref 13.0–17.0)
MCH: 32.5 pg (ref 26.0–34.0)
MCH: 33.5 pg (ref 26.0–34.0)
MCH: 33.5 pg (ref 26.0–34.0)
MCH: 33.6 pg (ref 26.0–34.0)
MCH: 33.6 pg (ref 26.0–34.0)
MCH: 33.7 pg (ref 26.0–34.0)
MCH: 34.3 pg — ABNORMAL HIGH (ref 26.0–34.0)
MCH: 34.4 pg — ABNORMAL HIGH (ref 26.0–34.0)
MCH: 34.5 pg — ABNORMAL HIGH (ref 26.0–34.0)
MCH: 34.5 pg — ABNORMAL HIGH (ref 26.0–34.0)
MCHC: 33.3 g/dL (ref 30.0–36.0)
MCHC: 33.7 g/dL (ref 30.0–36.0)
MCHC: 33.8 g/dL (ref 30.0–36.0)
MCHC: 34.1 g/dL (ref 30.0–36.0)
MCHC: 34.3 g/dL (ref 30.0–36.0)
MCHC: 34.4 g/dL (ref 30.0–36.0)
MCHC: 34.7 g/dL (ref 30.0–36.0)
MCHC: 34.7 g/dL (ref 30.0–36.0)
MCHC: 34.9 g/dL (ref 30.0–36.0)
MCHC: 35.2 g/dL (ref 30.0–36.0)
MCV: 97.5 fL (ref 78.0–100.0)
MCV: 97.8 fL (ref 78.0–100.0)
MCV: 98 fL (ref 78.0–100.0)
MCV: 98 fL (ref 78.0–100.0)
MCV: 98 fL (ref 78.0–100.0)
MCV: 98 fL (ref 78.0–100.0)
MCV: 98.8 fL (ref 78.0–100.0)
MCV: 98.8 fL (ref 78.0–100.0)
MCV: 99.2 fL (ref 78.0–100.0)
MCV: 99.6 fL (ref 78.0–100.0)
MCV: 99.7 fL (ref 78.0–100.0)
Platelets: 115 10*3/uL — ABNORMAL LOW (ref 150–400)
Platelets: 128 10*3/uL — ABNORMAL LOW (ref 150–400)
Platelets: 209 10*3/uL (ref 150–400)
Platelets: 413 10*3/uL — ABNORMAL HIGH (ref 150–400)
Platelets: 416 10*3/uL — ABNORMAL HIGH (ref 150–400)
Platelets: 464 10*3/uL — ABNORMAL HIGH (ref 150–400)
Platelets: 473 10*3/uL — ABNORMAL HIGH (ref 150–400)
Platelets: 517 10*3/uL — ABNORMAL HIGH (ref 150–400)
Platelets: 564 10*3/uL — ABNORMAL HIGH (ref 150–400)
Platelets: 591 10*3/uL — ABNORMAL HIGH (ref 150–400)
RBC: 2.41 MIL/uL — ABNORMAL LOW (ref 4.22–5.81)
RBC: 2.47 MIL/uL — ABNORMAL LOW (ref 4.22–5.81)
RBC: 2.49 MIL/uL — ABNORMAL LOW (ref 4.22–5.81)
RBC: 2.51 MIL/uL — ABNORMAL LOW (ref 4.22–5.81)
RBC: 2.51 MIL/uL — ABNORMAL LOW (ref 4.22–5.81)
RBC: 2.52 MIL/uL — ABNORMAL LOW (ref 4.22–5.81)
RBC: 2.77 MIL/uL — ABNORMAL LOW (ref 4.22–5.81)
RBC: 3.04 MIL/uL — ABNORMAL LOW (ref 4.22–5.81)
RBC: 3.19 MIL/uL — ABNORMAL LOW (ref 4.22–5.81)
RDW: 12.3 % (ref 11.5–15.5)
RDW: 12.3 % (ref 11.5–15.5)
RDW: 12.4 % (ref 11.5–15.5)
RDW: 12.4 % (ref 11.5–15.5)
RDW: 12.5 % (ref 11.5–15.5)
RDW: 12.5 % (ref 11.5–15.5)
RDW: 12.5 % (ref 11.5–15.5)
RDW: 12.5 % (ref 11.5–15.5)
RDW: 12.6 % (ref 11.5–15.5)
RDW: 12.7 % (ref 11.5–15.5)
RDW: 14.6 % (ref 11.5–15.5)
WBC: 10.5 10*3/uL (ref 4.0–10.5)
WBC: 11.8 10*3/uL — ABNORMAL HIGH (ref 4.0–10.5)
WBC: 11.9 10*3/uL — ABNORMAL HIGH (ref 4.0–10.5)
WBC: 12.2 10*3/uL — ABNORMAL HIGH (ref 4.0–10.5)
WBC: 12.4 10*3/uL — ABNORMAL HIGH (ref 4.0–10.5)
WBC: 13.4 10*3/uL — ABNORMAL HIGH (ref 4.0–10.5)
WBC: 14.9 10*3/uL — ABNORMAL HIGH (ref 4.0–10.5)
WBC: 16.3 10*3/uL — ABNORMAL HIGH (ref 4.0–10.5)
WBC: 7.4 10*3/uL (ref 4.0–10.5)

## 2011-03-08 LAB — PROTIME-INR
INR: 0.93 (ref 0.00–1.49)
INR: 2.09 — ABNORMAL HIGH (ref 0.00–1.49)
INR: 2.49 — ABNORMAL HIGH (ref 0.00–1.49)
INR: 4.31 — ABNORMAL HIGH (ref 0.00–1.49)
INR: 4.36 — ABNORMAL HIGH (ref 0.00–1.49)
INR: 4.94 — ABNORMAL HIGH (ref 0.00–1.49)
INR: 5.3 (ref 0.00–1.49)
Prothrombin Time: 12.7 seconds (ref 11.6–15.2)
Prothrombin Time: 16.8 seconds — ABNORMAL HIGH (ref 11.6–15.2)
Prothrombin Time: 18.6 seconds — ABNORMAL HIGH (ref 11.6–15.2)
Prothrombin Time: 23.6 seconds — ABNORMAL HIGH (ref 11.6–15.2)
Prothrombin Time: 25 seconds — ABNORMAL HIGH (ref 11.6–15.2)
Prothrombin Time: 27 seconds — ABNORMAL HIGH (ref 11.6–15.2)
Prothrombin Time: 41.2 seconds — ABNORMAL HIGH (ref 11.6–15.2)
Prothrombin Time: 41.6 seconds — ABNORMAL HIGH (ref 11.6–15.2)
Prothrombin Time: 45.8 seconds — ABNORMAL HIGH (ref 11.6–15.2)
Prothrombin Time: 48.3 seconds — ABNORMAL HIGH (ref 11.6–15.2)

## 2011-03-08 LAB — HEMOCCULT GUIAC POC 1CARD (OFFICE): Fecal Occult Bld: NEGATIVE

## 2011-03-08 LAB — ABO/RH: ABO/RH(D): A NEG

## 2011-03-08 LAB — CROSSMATCH
ABO/RH(D): A NEG
ABO/RH(D): A NEG
Antibody Screen: NEGATIVE
Antibody Screen: NEGATIVE
Unit division: 0
Unit division: 0
Unit division: 0
Unit division: 0
Unit division: 0

## 2011-03-08 LAB — HEPATIC FUNCTION PANEL
ALT: 33 U/L (ref 0–53)
AST: 33 U/L (ref 0–37)
Albumin: 2.8 g/dL — ABNORMAL LOW (ref 3.5–5.2)
Alkaline Phosphatase: 59 U/L (ref 39–117)
Bilirubin, Direct: 0.1 mg/dL (ref 0.0–0.3)
Indirect Bilirubin: 0.4 mg/dL (ref 0.3–0.9)
Total Bilirubin: 0.5 mg/dL (ref 0.3–1.2)
Total Protein: 5.4 g/dL — ABNORMAL LOW (ref 6.0–8.3)

## 2011-03-08 LAB — URINE MICROSCOPIC-ADD ON

## 2011-03-08 LAB — POCT I-STAT, CHEM 8
BUN: 13 mg/dL (ref 6–23)
Calcium, Ion: 1.12 mmol/L (ref 1.12–1.32)
Calcium, Ion: 1.16 mmol/L (ref 1.12–1.32)
Chloride: 101 mEq/L (ref 96–112)
Chloride: 108 mEq/L (ref 96–112)
Glucose, Bld: 115 mg/dL — ABNORMAL HIGH (ref 70–99)
Glucose, Bld: 120 mg/dL — ABNORMAL HIGH (ref 70–99)
Glucose, Bld: 171 mg/dL — ABNORMAL HIGH (ref 70–99)
HCT: 26 % — ABNORMAL LOW (ref 39.0–52.0)
HCT: 33 % — ABNORMAL LOW (ref 39.0–52.0)
Hemoglobin: 11.2 g/dL — ABNORMAL LOW (ref 13.0–17.0)
Hemoglobin: 8.8 g/dL — ABNORMAL LOW (ref 13.0–17.0)
Potassium: 4.3 mEq/L (ref 3.5–5.1)

## 2011-03-08 LAB — URINALYSIS, ROUTINE W REFLEX MICROSCOPIC
Bilirubin Urine: NEGATIVE
Glucose, UA: NEGATIVE mg/dL
Ketones, ur: NEGATIVE mg/dL
Nitrite: NEGATIVE
Nitrite: NEGATIVE
Specific Gravity, Urine: 1.021 (ref 1.005–1.030)
Specific Gravity, Urine: 1.026 (ref 1.005–1.030)
pH: 5 (ref 5.0–8.0)
pH: 6.5 (ref 5.0–8.0)

## 2011-03-08 LAB — MAGNESIUM
Magnesium: 2.3 mg/dL (ref 1.5–2.5)
Magnesium: 2.8 mg/dL — ABNORMAL HIGH (ref 1.5–2.5)

## 2011-03-08 LAB — URINE CULTURE
Colony Count: NO GROWTH
Culture: NO GROWTH

## 2011-03-08 LAB — DIFFERENTIAL
Eosinophils Relative: 1 % (ref 0–5)
Lymphocytes Relative: 12 % (ref 12–46)
Monocytes Absolute: 0.9 10*3/uL (ref 0.1–1.0)
Monocytes Relative: 6 % (ref 3–12)
Neutro Abs: 13.2 10*3/uL — ABNORMAL HIGH (ref 1.7–7.7)

## 2011-03-08 LAB — CREATININE, SERUM
Creatinine, Ser: 0.98 mg/dL (ref 0.4–1.5)
GFR calc Af Amer: >60 mL/min (ref >60)
GFR calc non Af Amer: >60 mL/min (ref >60)

## 2011-03-08 LAB — POCT I-STAT 3, VENOUS BLOOD GAS (G3P V)
Acid-base deficit: 1 mmol/L (ref 0.0–2.0)
O2 Saturation: 83 %
TCO2: 27 mmol/L (ref 0–100)

## 2011-03-08 LAB — POCT I-STAT 7, (LYTES, BLD GAS, ICA,H+H)
Acid-base deficit: 4 mmol/L — ABNORMAL HIGH (ref 0.0–2.0)
O2 Saturation: 100 %
TCO2: 22 mmol/L (ref 0–100)
pCO2 arterial: 36.5 mmHg (ref 35.0–45.0)
pO2, Arterial: 278 mmHg — ABNORMAL HIGH (ref 80.0–100.0)

## 2011-03-08 LAB — MRSA PCR SCREENING: MRSA by PCR: NEGATIVE

## 2011-03-08 LAB — COMPREHENSIVE METABOLIC PANEL
AST: 20 U/L (ref 0–37)
BUN: 14 mg/dL (ref 6–23)
CO2: 24 mEq/L (ref 19–32)
Calcium: 9.1 mg/dL (ref 8.4–10.5)
Chloride: 103 mEq/L (ref 96–112)
Creatinine, Ser: 0.84 mg/dL (ref 0.4–1.5)
GFR calc Af Amer: >60 mL/min (ref >60)
GFR calc non Af Amer: >60 mL/min (ref >60)
Glucose, Bld: 112 mg/dL — ABNORMAL HIGH (ref 70–99)
Total Bilirubin: 1 mg/dL (ref 0.3–1.2)

## 2011-03-08 LAB — SURGICAL PCR SCREEN: MRSA, PCR: NEGATIVE

## 2011-03-08 LAB — TYPE AND SCREEN: Antibody Screen: NEGATIVE

## 2011-03-08 LAB — APTT
aPTT: 53 seconds — ABNORMAL HIGH (ref 24–37)
aPTT: 65 seconds — ABNORMAL HIGH (ref 24–37)

## 2011-03-08 LAB — POCT CARDIAC MARKERS: CKMB, poc: 1.7 ng/mL (ref 1.0–8.0)

## 2011-03-08 LAB — POCT I-STAT GLUCOSE: Operator id: 3342

## 2011-03-09 LAB — POCT I-STAT 3, VENOUS BLOOD GAS (G3P V)
Acid-base deficit: 1 mmol/L (ref 0.0–2.0)
Bicarbonate: 25.5 mEq/L — ABNORMAL HIGH (ref 20.0–24.0)
O2 Saturation: 63 %
pO2, Ven: 35 mmHg (ref 30.0–45.0)

## 2011-03-09 LAB — POCT I-STAT 3, ART BLOOD GAS (G3+)
O2 Saturation: 93 %
TCO2: 26 mmol/L (ref 0–100)
pCO2 arterial: 40.1 mmHg (ref 35.0–45.0)
pH, Arterial: 7.403 (ref 7.350–7.450)
pO2, Arterial: 67 mmHg — ABNORMAL LOW (ref 80.0–100.0)

## 2011-03-12 ENCOUNTER — Encounter (INDEPENDENT_AMBULATORY_CARE_PROVIDER_SITE_OTHER): Payer: BC Managed Care – PPO

## 2011-03-12 ENCOUNTER — Encounter: Payer: Self-pay | Admitting: Cardiology

## 2011-03-12 DIAGNOSIS — I359 Nonrheumatic aortic valve disorder, unspecified: Secondary | ICD-10-CM

## 2011-03-12 DIAGNOSIS — I4891 Unspecified atrial fibrillation: Secondary | ICD-10-CM

## 2011-03-12 DIAGNOSIS — Z7901 Long term (current) use of anticoagulants: Secondary | ICD-10-CM

## 2011-03-16 NOTE — Medication Information (Signed)
Summary: rov/pc  Anticoagulant Therapy  Managed by: Cloyde Reams, RN, BSN Referring MD: Patience Musca MD: Shirlee Latch MD, Elanore Talcott Indication 1: Aortic Valve Replacement Indication 2: Atrial Fibrillation Valve Type: [facility] Judes -- Mechanical Lab Used: [facility] [place] Site: [address] INR POC 2.3 INR RANGE 2.5-3.0  Dietary changes: no    Health status changes: no    Bleeding/hemorrhagic complications: no    Recent/future hospitalizations: no    Any changes in medication regimen? no    Recent/future dental: no  Any missed doses?: yes     Details: Missed Coumadin dosage on Tuesday 03/09/11.   Is patient compliant with meds? yes       Allergies: No Known Drug Allergies  Anticoagulation Management History:      The patient is taking warfarin and comes in today for a routine follow up visit.  Negative risk factors for bleeding include an age less than 43 years old.  The bleeding index is 'low risk'.  Positive CHADS2 values include History of HTN.  Negative CHADS2 values include Age > 15 years old.  His last INR was 1.0 ratio.  Anticoagulation responsible provider: Shirlee Latch MD, Solae Norling.  INR POC: 2.3.  Cuvette Lot#: [id number].  Exp: 02/2012.    Anticoagulation Management Assessment/Plan:      The patient's current anticoagulation dose is Coumadin 2.5 mg tabs: as directed.  The target INR is 2.5-3.0.  The next INR is due 04/09/2011.  Anticoagulation instructions were given to patient.  Results were reviewed/authorized by Cloyde Reams, RN, BSN.  He was notified by Cloyde Reams RN.         Prior Anticoagulation Instructions: INR 2.1 Take 1 tablet today (5mg  total).  Then resume taking 1/2 tablet every day. Recheck in 3 weeks.  Current Anticoagulation Instructions: INR 2.3  Take 5mg  today, then resume same dosage 2.5mg  daily.  Recheck in 4 weeks.

## 2011-04-09 ENCOUNTER — Ambulatory Visit (INDEPENDENT_AMBULATORY_CARE_PROVIDER_SITE_OTHER): Payer: BC Managed Care – PPO | Admitting: *Deleted

## 2011-04-09 DIAGNOSIS — I4891 Unspecified atrial fibrillation: Secondary | ICD-10-CM

## 2011-04-09 DIAGNOSIS — Q231 Congenital insufficiency of aortic valve: Secondary | ICD-10-CM

## 2011-04-09 LAB — POCT INR: INR: 2.1

## 2011-04-09 NOTE — Patient Instructions (Addendum)
Today take 2 tablets. Change your Friday dose to 2 tablets, and 1 tablet all other days. Recheck in 2 weeks.

## 2011-04-23 ENCOUNTER — Ambulatory Visit (INDEPENDENT_AMBULATORY_CARE_PROVIDER_SITE_OTHER): Payer: BC Managed Care – PPO | Admitting: *Deleted

## 2011-04-23 DIAGNOSIS — I4891 Unspecified atrial fibrillation: Secondary | ICD-10-CM

## 2011-04-23 DIAGNOSIS — Q231 Congenital insufficiency of aortic valve: Secondary | ICD-10-CM

## 2011-05-07 ENCOUNTER — Ambulatory Visit (INDEPENDENT_AMBULATORY_CARE_PROVIDER_SITE_OTHER): Payer: BC Managed Care – PPO | Admitting: *Deleted

## 2011-05-07 ENCOUNTER — Telehealth: Payer: Self-pay | Admitting: Cardiovascular Disease

## 2011-05-07 DIAGNOSIS — I4891 Unspecified atrial fibrillation: Secondary | ICD-10-CM

## 2011-05-07 DIAGNOSIS — Q231 Congenital insufficiency of aortic valve: Secondary | ICD-10-CM

## 2011-05-07 LAB — POCT INR: INR: 4

## 2011-05-07 NOTE — Telephone Encounter (Signed)
Pt called.  Sheet said to take 5mg  on Monday but the chart did not reflect this.  Told pt to follow what the words on his instruction sheet said and not what the chart said.  Will correct at next visit.

## 2011-05-07 NOTE — Telephone Encounter (Signed)
Has a question regarding dose from this mornings pt ck.

## 2011-05-11 NOTE — Assessment & Plan Note (Signed)
OFFICE VISIT   Alexander Payne, TERPSTRA  DOB:  17-May-1951                                        December 22, 2010  CHART #:  [id number]   The patient returned to my office today for followup status post [place]  procedure on December 03, 2010.  He did well initially postoperatively  but then was readmitted on December 11, 2010, with sudden development of  weakness and shortness of breath and was diagnosed with a pericardial  tamponade.  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  Apparently, his INR  had increased to about 5 prior to this.  He said this developed rather  suddenly.  An uncomplicated course after the pericardial window, he was  discharged home.  Since being at home, he said that he has been feeling  well.  He is walking daily without chest pain or shortness of breath.  His INR was checked last Thursday and he said it was around 2.4.   His medications are:  1. Amiodarone 200 mg b.i.d.  2. Metoprolol 12.5 mg t.i.d.  3. Coumadin 2.5 mg daily.  4. Digoxin 0.25 mg daily.  5. Tylenol p.r.n. for pain.  6. He has aspirin 325 mg was his one of his medications on his      medication list, but he said he has not been taking that.   Follow up chest x-ray today shows clear lung fields and no pleural  effusions.  The cardiac silhouette appears of normal size and shape.   IMPRESSION:  Overall, the patient is now recovering well following his  procedure.  I suspect he probably had some sudden bleeding secondary to  elevated INR in the postoperative inflammatory state.  He is scheduled  to have his INR checked again later this week in the [facility].  I told him he could return to driving a car,  but should refrain lifting anything heavier than 10 pounds for a total  of 3 months from date of surgery.  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  He will continue to follow up  with Dr. Eden Emms, and will contact me if he develops any problems with  his incisions.   Evelene Croon, M.D.  Electronically Signed   BB/MEDQ  D:  12/22/2010  T:  12/23/2010  Job:  [id number]   cc:   Noralyn Pick. Eden Emms, MD, [facility]  Eugenio Hoes. Tawanna Cooler, MD

## 2011-05-11 NOTE — Assessment & Plan Note (Signed)
[facility]                            CARDIOLOGY OFFICE NOTE   NAME:Payne, Alexander FLAUM                    MRN:          [id number]  DATE:10/14/2008                            DOB:          Oct 16, 1951    Alexander Payne returns today for followup.  He has severe aortic stenosis.   His last echo done in January 2009 showed a mean gradient of 39 mmHg and  peak gradient of 74 mmHg.  He is asymptomatic and has normal LV function  with an EF of 55-65%.  There is no documented coronary artery disease.   I talked to the patient at length.  He tends to be sedentary, but  clearly is not having any presyncope, chest pain, or dyspnea.   I told him I would like him to have an exercise stress Myoview study to  rule out coronary artery disease.   He has had a history of PAF and hypertension.  He has been maintaining  sinus rhythm.   His teeth are in good shape.  He just went to the dentist.  We had a  long discussion regarding the timing of surgery.  I told him he would  likely need an aortic valve replacement within 5 years.   I also went over the warning signs in regards to increasing shortness of  breath, presyncope and chest pain.   I told him the things that would make [place] change our mind regarding  watching him at this time with the development of symptoms and abnormal  stress test or decreased LV function on echo and possibly marked  progression of gradients.   REVIEW OF SYSTEMS:  Otherwise negative.   ALLERGIES:  He has no known allergies.   MEDICATIONS:  1. Aspirin a day.  2. Toprol 25 a day.  3. Amiodarone 200 a day.  4. Lisinopril 5 a day.   PHYSICAL EXAMINATION:  GENERAL:  Remarkable for middle-aged white male  in no distress.  VITAL SIGNS:  Blood pressure is 150/80, pulse 62 and regular,  respiratory rate 14, afebrile, weight 206.  HEENT:  Unremarkable.  NECK:  Carotids have mild parvus and tardus and transmitted murmur.  No  lymphadenopathy,  thyromegaly, or JVP elevation.  LUNGS:  Clear with good diaphragmatic motion.  No wheezing.  S1, second  heart sound is diminished.  There is an AS and AI murmur.  PMI normal.  ABDOMEN:  Benign.  Bowel sounds positive.  No AAA.  No tenderness.  No  bruit.  No hepatosplenomegaly, hepatojugular reflux, or tenderness.  EXTREMITIES:  Distal pulses are intact.  No edema.  NEURO:  Nonfocal.  SKIN:  Warm and dry.  MUSCULOSKELETAL:  No muscular weakness.   IMPRESSION:  1. Severe aortic stenosis, currently asymptomatic.  Continue to watch.      Continue subacute bacterial endocarditis prophylaxis.  Follow up      echo in 6 months.  2. Rule out coronary artery disease in the setting of severe aortic      stenosis.  Follow up exercise stress Myoview.  I would like to make  sure he has a normal hemodynamic response and is truly asymptomatic      with a reasonable workload.  3. Hypertension currently well controlled.  Continue low-dose      lisinopril and Toprol.  4. History of paroxysmal atrial fibrillation.  Continue beta-blocker      and low-dose amiodarone.  I will see him back in 6 months as long      as his stress Myoview is low-risk.     Noralyn Pick. Eden Emms, MD, [facility]  Electronically Signed    PCN/MedQ  DD: 10/14/2008  DT: 10/14/2008  Job #: [id number]

## 2011-05-11 NOTE — Consult Note (Signed)
NEW PATIENT CONSULTATION   Alexander Payne, Alexander Payne  DOB:  04-10-1951                                        November 24, 2010  CHART #:  [id number]   REASON FOR CONSULTATION:  Bicuspid aortic valve with severe aortic  stenosis and mild-to-moderate aortic insufficiency as well as an aortic  root aneurysm.   CLINICAL HISTORY:  I was asked by Dr. Eden Emms to evaluate the patient for  consideration of surgical treatment of the above problems.  He is a 60-  year-old gentleman with a history of hypertension and paroxysmal atrial  fibrillation in the past who has a known bicuspid aortic valve that has  been followed by serial echocardiogram.  His most recent echocardiogram  on 10/23/2010, showed left ventricular cavity size was normal with  moderate left ventricular hypertrophy.  Systolic function was normal  with ejection fraction of 60-65%.  There is grade 1 diastolic  dysfunction.  The aortic valve was bicuspid with mildly calcified  leaflets and severe stenosis with mild regurgitation.  The aorta was  felt to be moderately dilated.  The mitral valve had a calcified annulus  with trivial regurgitation.  The aortic valve peak gradient had  increased to 99 and mean gradient had increased to 54.  The indexed  aortic valve area was 0.57 cm2 per meter squared by VTI and 0.43 cm2 per  meter squared by VMAX.  The peak callosity was 4.8 cm/sec.  He underwent  CT angiogram of the chest on 11/06/2010 to evaluate the aorta.  This  showed the aortic root at the level of the sinuses of Valsalva to be 4.8  cm in diameter compared to previous cardiac MRI on 04/16/2008, when it  was 4.5 cm.  Distal to the sinus of Valsalva the ascending aorta  measured 4 cm in diameter.  The aortic arch was within normal limits.  Descending aorta measured 3.9 cm.  He subsequently underwent cardiac  catheterization on 11/02/2010, which showed about 40% ostial LAD stenosis  and about 30-40% eccentric  proximal LAD stenosis.  There is about 28-30%  ostial disease in the first obtuse marginal branch.  The aortic root  injection showed some dilatation of the aortic root with heavy  calcification of the aortic valve and the right coronary sinus of  Valsalva.   REVIEW OF SYSTEMS:  GENERAL:  He denies any fever or chills.  He has had  no recent weight changes.  He denies fatigue.  EYES:  Negative.  ENT:  Negative.  He does see a dentist regularly.  He denies any pain in  his mouth or jaw.  CARDIOVASCULAR:  He denies any chest pain or pressure.  He denies any  exertional dyspnea.  He has had no PND or orthopnea.  He denies  palpitations and peripheral edema.  RESPIRATORY:  He denies cough and sputum production.  ENDOCRINE:  He denies diabetes and hypothyroidism.  GI:  He has had no nausea or vomiting.  He denies melena and bright red  blood per rectum.  GU:  He denies dysuria and hematuria.  VASCULAR:  He denies claudication and phlebitis.  NEUROLOGICAL:  He denies any focal weakness or numbness.  He denies  dizziness and syncope.  He has never had TIA or stroke.  MUSCULOSKELETAL:  He denies arthralgias and myalgias.  PSYCHIATRIC:  Negative.  HEMATOLOGIC:  He denies any history of bleeding disorders or easy  bleeding.   ALLERGIES:  None.   MEDICATIONS:  1. Amiodarone 200 mg daily.  2. Lisinopril 15 mg daily.  3. Lopressor 12.5 mg b.i.d.  4. Aspirin 325 mg daily.  5. Fish oil 1000 mg daily.  6. Multivitamin daily.   PAST MEDICAL HISTORY:  Significant for hypertension.  He has a history  of paroxysmal atrial fibrillation which was diagnosed in 2007 or 2008.  He initially had cardioversion, but returned to atrial fibrillation, but  has maintained sinus rhythm on 200 mg of amiodarone per day.  He has a  history of bicuspid aortic valve disease with aortic stenosis and aortic  insufficiency as mentioned above.  He has a history of previous  endocarditis.  His initial episode was in  1984 with possible recurrence  in November 2007.  His blood cultures grew out microaerophilic strep in  2007.  He had another admission in June 2008, for fever, weakness and  myalgias.  Blood cultures were negative and TEE showed no evidence of  vegetation.  He was treated with antibiotics for possible endocarditis.  He is status post exploratory laparotomy many years ago for acute  abdomen showed not to be appendicitis.   SOCIAL HISTORY:  He is married and has 2 children.  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  He is  a previous smoker, but quit 3 years ago.  He drinks about 2-3 beers per  day.   FAMILY HISTORY:  Negative for aortic valve disease or aneurysm disease.  There was no history of coronary disease.   PHYSICAL EXAMINATION:  Vital Signs:  His blood pressure is 136/78, pulse  is 64 and regular, respiratory rate is 18 unlabored.  Oxygen saturation  on room air is 94%.  General:  He is a well-developed white male, in no  distress.  HEENT:  Normocephalic and atraumatic.  Pupils are equal and  reactive to light and accommodation.  Extraocular muscles are intact.  Oropharynx is clear.  Teeth are in good condition.  Neck:  Normal  carotid pulses bilaterally.  There is a transmitted murmur to both sides  in the neck.  There is no adenopathy or thyromegaly.  Cardiac:  Regular  rate and rhythm with a grade 3/6 harsh systolic murmur or aortic  stenosis and a 1-2/6 aortic insufficiency murmur.  Lungs:  Clear.  Abdomen:  Active bowel sounds.  His abdomen is soft and nontender.  There are no palpable masses or organomegaly.  There is an old midline  laparotomy scar.  Extremities:  Have no peripheral edema.  Pedal pulses  are palpable bilaterally.  Skin:  Warm and dry.  Neurologic:  Alert and  oriented x3.  Motor and sensory exam is grossly normal.   IMPRESSION:  The patient has a bicuspid aortic valve with severe aortic  stenosis and  mild-to-moderate aortic insufficiency as well as aortic  root dilatation with a maximum transverse diameter at the sinus level of  4.8 cm.  This is slightly larger than MRI in 2009, which was 4.5 cm.  He  has insignificant coronary disease.  Although he is asymptomatic, I  agree with Dr. Eden Emms that it is time to proceed with aortic valve  replacement.  Given the size of his aortic root and the association of  ascending aortopathy with bicuspid aortic valve disease, I think it  would be prudent to replace his aortic  root at the same time.  This does  increase the risk of surgery due to the heavy calcification of his  aortic annulus and right aortic sinuses.  The coronary ostia appeared to  be elevated fairly high away from the annulus which is also consistent  with aortic sinus dilatation.  He is at increased risk for postoperative  complete heart block given the heavy calcification around the aortic  valve.  Given his age of 43 years, I would probably recommend a  mechanical valve conduit.  I discussed the CT scan, echocardiogram, and  cath findings with the patient and his wife and reviewed the surgical  treatment options.  We discussed the pros and cons of mechanical and  tissue valves.  I told him that I thought there was probably a 25%  chance that a tissue valve would suffer structural valve deterioration  over the next 12-15 years in the patient of his age and may require  replacement.  He also has a history of paroxysmal atrial fibrillation  and certainly may end up on Coumadin long-term as he ages for that  problem.  He would like to think about valve choice of a longer.  He  would like to wait until after the Christmas holiday to have surgery and  I told him that I thought that was fine.  There was no urgency to  perform his surgery, although I have advised him not to wait longer than  a couple months.  I discussed the benefits and risks of surgery  including, but not limited to  bleeding, blood transfusion, infection,  stroke, myocardial infarction, coronary anastomotic stenosis, heart  block requiring permanent pacemaker, organ dysfunction, and death.  He  understands all this and  would like to proceed with surgery.  He will think about the timing and  will contact my office.  I will plan to see him back in the office  before that to review things with him.   Evelene Croon, M.D.  Electronically Signed   BB/MEDQ  D:  11/24/2010  T:  11/25/2010  Job:  [id number]   cc:   Noralyn Pick. Eden Emms, MD, [facility]  Eugenio Hoes. Tawanna Cooler, MD

## 2011-05-11 NOTE — H&P (Signed)
NAME:  Alexander Payne, Alexander Payne             ACCOUNT NO.:  [id number]   MEDICAL RECORD NO.:  [id number]          PATIENT TYPE:  INP   LOCATION:  4707                         FACILITY:  [facility]   PHYSICIAN:  Tereso Newcomer, PA-C     DATE OF BIRTH:  03/08/1951   DATE OF ADMISSION:  05/30/2007  DATE OF DISCHARGE:                              HISTORY & PHYSICAL   CHIEF COMPLAINT:  Fever and weakness.   HISTORY OF PRESENT ILLNESS:  This is a 60 year old male patient with a  history of bicuspid aortic valve with a history of subacute bacterial  endocarditis in 1984 and most recently presumed bacterial endocarditis  November 2007.  He has aortic stenosis and aortic insufficiency and has  worsened over time.  His last visit with Dr. Eden Emms after his recent  admission for presumed endocarditis it was suspected he would go for  surgical correction of his valve disorder in the next 3-5 years.  He  presents to the emergency room today with 2 days of diffuse myalgias and  flu like symptoms.  He denies any recent dental work.  Denies any other  family members being ill.  His temperature was recorded at 103 this  morning around 1 o'clock.  It woke him up from sleep.  He had associated  chills.  He took some Tylenol and his temperature went to normal later  in the day.  Then later today he developed recurrent fever and decided  to call our office.  He was directed to come to the emergency room.  He  notes that his symptoms are somewhat reminiscent of his previous bouts  of bacterial endocarditis.  He denies any riggers.  Denies any cough,  dysuria, rashes, joint aches or swelling.   PAST MEDICAL HISTORY:  1. Significant for normal coronaries at catheterization August 2006.  2. Good LV function with EF 55 to 65% by echocardiogram April 2008.  3. Bicuspid aortic valve.  Moderate aortic stenosis with mean gradient      of 20 mmHg.  Moderate to severe aortic insufficiency.  4. Questionable patent foramen ovale by  transesophageal echocardiogram      November 2007.  5. History of bacterial endocarditis 1984 with possible recurrence of      bacterial endocarditis November 2007.  His blood cultures grew out      microaerophilic strep at the time of his admission November 2007.      He was discharged to home on 4 weeks of IV Ceftin.  6. History of hypertension.  He is no longer on medical therapy and      has remained normotensive.   MEDICATIONS:  Aspirin 81 mg daily, multivitamin and vitamin C.   ALLERGIES:  NO KNOWN DRUG ALLERGIES.   SOCIAL HISTORY:  Lives in [place].  He is married, has two daughters.  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  He drinks one to two beers per night.  Denies any tobacco abuse.  Denies any drug abuse.   FAMILY HISTORY:  Significant for coronary disease.   REVIEW OF SYSTEMS:  Please see the HPI.  Denies any headache  or  congestion.  Denies any sore throat.  Denies chest pain, shortness of  breath, dyspnea on exertion, orthopnea, paroxysmal dyspnea, edema,  palpitations, syncope, near-syncope, cough.  Denies any dysuria,  hematuria, nausea, vomiting, diarrhea, bright red blood per rectum or  melena.  Rest of the review of systems are negative.   PHYSICAL EXAM:  GENERAL:  He is well-nourished well-developed male.  VITAL SIGNS:  Blood pressure is 122/69, pulse 99, respirations 20,  temperature 102.8, oxygen saturation 96% on room air.  HEENT:  Head normocephalic and atraumatic.  Eyes:  PERRLA.  Sclerae  clear.  Oropharynx pink without exudate.  No lesions noted.  He has  dentures in place.  NECK:  Without JVD.  LYMPH:  Without lymphadenopathy.  ENDOCRINE:  Without thyromegaly.  Carotids with bilateral radiating  murmurs.  CARDIAC:  Normal S1-S2 with a 2 to 3/6 crescendo decrescendo systolic  ejection murmur heard best at the right upper sternal border.  I cannot  appreciate a diastolic murmur at this time.  LUNGS:  Clear to auscultation bilaterally without wheezing,  rhonchi or  rales.  ABDOMEN:  Abdomen is soft, nontender with normoactive bowel sounds.  No  organomegaly.  EXTREMITIES:  Without clubbing, cyanosis or edema.  MUSCULOSKELETAL:  Without joint deformity.  NEUROLOGIC:  He is alert and oriented x3.  Cranial nerves II-XII grossly  intact.  SKIN:  Without rash.  I cannot appreciate any splinter hemorrhages or  Janeway lesions.   Chest x-ray is pending.  EKG is pending.  Laboratory data is pending.   IMPRESSION:  1. Fevers, chills, myalgias.      a.     Rule out subacute bacterial endocarditis.  2. History of subacute bacterial endocarditis 1984 and likely      recurrence in 2007.      a.     Cultures in November 2007 did grow out microaerophilic strep       and he was treated for 4 weeks with IV Ceftin.  3. Bicuspid aortic valve.      a.     Moderate aortic stenosis with a mean gradient of 28 mmHg.      b.     Moderate to severe aortic insufficiency.  4. History of hypertension now off medications.  5. Normal coronaries by catheterization 2006.   PLAN:  The patient will be admitted to telemetry bed.  We will check a  CBC with differential, sed rate, comprehensive metabolic panel, chest x-  ray, urinalysis with culture and sensitivity, blood cultures x3 and  transthoracic echocardiogram.  After his cultures have been drawn he  will be started on empiric vancomycin and gentamicin.  We will have a  low threshold to proceed with transesophageal echocardiogram as well as  infectious disease consultation.  The patient was also interviewed and  examined by Dr. Arvilla Meres.      Tereso Newcomer, PA-C     SW/MEDQ  D:  05/30/2007  T:  05/31/2007  Job:  [id number]   cc:   Noralyn Pick. Eden Emms, MD, [facility]  Eugenio Hoes. Tawanna Cooler, MD  Cliffton Asters, M.D.

## 2011-05-11 NOTE — Assessment & Plan Note (Signed)
[facility]                            CARDIOLOGY OFFICE NOTE   NAME:Alexander Payne                    MRN:          [id number]  DATE:06/19/2007                            DOB:          12-Apr-1951    Alexander Payne returns today for followup. He was just discharged from the  hospital. He had a febrile illness. He has a history of 2 episodes of  SBE with a bicuspid aortic valve. He was worked up extensively and had  an ID consult. There was no evidence of bacteremia. His TEE continued to  show moderate AS with moderate to severe AI. He had an episode of A fib  flutter which was self limiting.   Overall Alexander Payne has been doing well since he left the hospital. He has  not had any significant recurrence of his fevers. There has been no  diaphoresis. In regards to his heart, he has not had any significant  palpitations or evidence of recurrent A fib.   He has been compliant with his medications.   He tends to be running high blood pressures when we see him here in the  office. I asked him to check this at home and make sure he was running  low. He is on low dose beta blockers. We had had him on lisinopril in  the past but given his fixed aortic stenosis, I am not sure I would  chose this drug.   REVIEW OF SYSTEMS:  Otherwise negative.   MEDICATIONS:  1. Amiodarone 200 b.i.d.  2. Toprol 25 a day.  3. Aspirin a day.   PHYSICAL EXAMINATION:  GENERAL:  Remarkable for an afebrile, healthy-  appearing, white male in no distress. Affect is appropriate.  VITAL SIGNS:  The weight is 221, pulse 61, blood pressure is 150/63.  Respiratory rate is 14.  HEENT:  Normal.  NECK:  Carotids have mild parvus et tardus with a transmitted murmur.  JVP is normal. There is no thyromegaly and no lymphadenopathy.  LUNGS:  Clear. Good diaphragmatic motion. No wheezing.  HEART:  There is a fairly loud AS and AI murmur particularly at the  right sternal border. PMI is increased  but not laterally displaced.  ABDOMEN:  Benign. Bowel sounds pursestring suture. No  hepatosplenomegaly. No AAA. No hepatojugular reflux, no tenderness. The  femorals are somewhat bounding, +4, no bruits.  EXTREMITIES:  PTs  are +3. No lower extremity edema.  NEUROLOGIC:  Nonfocal. No muscular weakness.   IMPRESSION:  1. Bicuspid aortic valve with recent febrile illness, no evidence of      SBE. Continue to monitor by ECHO.  2. Moderate aortic sclerosis and moderate to severe aortic      insufficiency. Currently no evidence of heart failure. Followup      ECHO in 6 months. Hopefully we can keep him from having a valve      replacement until his 60s.  3. Hypertension, borderline control. Continue Toprol 25 a day. Norvasc      given as a generic prescription to take if blood pressures run high      at home.  4. History of paroxysmal atrial fibrillation currently not on      Coumadin. Continue an aspirin a day. Amiodarone for another 3-6      months. Will hopefully then be able to stop it. Continue beta      blocker.  5. Health maintenance. The patient will make sure he sees the dentist      on a regular basis in regards to his valve disease and preventing      SBE. I will see him back in 6 months unless he starts his Norvasc      for high blood pressure in which case I will see him in 3 months.     Noralyn Pick. Eden Emms, MD, [facility]  Electronically Signed    PCN/MedQ  DD: 06/19/2007  DT: 06/19/2007  Job #: [id number]

## 2011-05-11 NOTE — Cardiovascular Report (Signed)
NAME:  Alexander Payne, Alexander Payne             ACCOUNT NO.:  [id number]   MEDICAL RECORD NO.:  [id number]          PATIENT TYPE:  INP   LOCATION:  4707                         FACILITY:  [facility]   PHYSICIAN:  Noralyn Pick. Eden Emms, MD, FACCDATE OF BIRTH:  Jun 24, 1951   DATE OF PROCEDURE:  06/02/2007  DATE OF DISCHARGE:                            CARDIAC CATHETERIZATION   Mr. Bruun is a 55-year patient with bicuspid aortic valve.  He has  had previous endocarditis. He was admitted to hospital with fever. Study  was done to rule out endocarditis.  The patient also went into rapid  atrial fibrillation this morning.  We plan on cardioverting him if there  was no left atrial appendage thrombus.   The patient received 100 mcg of fentanyl and 9 mg of Versed.  Using  digital technique an Omniplane probe was advanced in distal esophagus  without incident.   Transgastric imaging revealed mild LVH with normal LV function, an EF of  60%.  Mitral valve was mildly thickened with mild MR.  There is no  endocarditis in the valve and intervalvular fibrosis was normal. Aortic  valve was bicuspid.  There is significant calcification of the  commissures. There was moderate to severe aortic insufficiency. By  transthoracic echo there was also significant aortic stenosis.  However,  the valve seemed to open fairly well and I doubt that there was severe  AS.   There is no evidence of periaortic abscess, we were actually able to see  is left-sided coronary arteries quite well and had normal flow. The  ascending aortic root was only mildly dilated.   Right-sided cardiac chambers were normal.  The tricuspid and pulmonary  valves were normal with no evidence of endocarditis. Left atrial  appendage had no thrombus.  There is no contraindication to  cardioversion.   The imaging of the aorta showed no significant debris.   FINAL IMPRESSION:  1. Bicuspid aortic valve with moderate severe aortic insufficiency and      some  degree of stenosis.  See transthoracic report.  No evidence of      endocarditis or vegetation.  2. Mild mitral regurgitation.  No evidence of abscess or endocarditis      on the mitral valve.  3. Normal left ventricular function.  Ejection fraction 60%.  4. Normal right-sided cardiac chambers with no right-sided valve      endocarditis.  5. No left atrial appendage thrombus.   The patient had good sedation with fentanyl and Versed.  We cardioverted  the patient x1 with 150 joules biphasic shock.  He converted to normal  sinus rhythm and he tolerated the procedure well.   FINAL IMPRESSION:  1. Successful TEE guided cardioversion.  2. No evidence of endocarditis.      Noralyn Pick. Eden Emms, MD, [facility]  Electronically Signed     PCN/MEDQ  D:  06/02/2007  T:  06/02/2007  Job:  [id number]

## 2011-05-11 NOTE — Discharge Summary (Signed)
NAME:  Alexander Payne, DUPRIEST             ACCOUNT NO.:  [id number]   MEDICAL RECORD NO.:  [id number]          PATIENT TYPE:  INP   LOCATION:  4707                         FACILITY:  [facility]   PHYSICIAN:  Noralyn Pick. Eden Emms, MD, FACCDATE OF BIRTH:  1951/08/20   DATE OF ADMISSION:  05/30/2007  DATE OF DISCHARGE:  06/03/2007                               DISCHARGE SUMMARY   PRIMARY CARDIOLOGIST:  Dr. Maurine Cane.   PRIMARY CARE PHYSICIAN:  Dr. Kelle Darting.   DISCHARGING DIAGNOSES:  1. Febrile illness of unknown etiology.  Blood cultures negative x2      sets this admission.  Urinalysis negative.  2. New onset atrial fib.  The patient status post TEE with direct      current cardioversion this admission, with her resumption of atrial      fib, rate controlled.   PAST MEDICAL HISTORY:  Includes:  1. Cardiac catheterization with normal coronaries in 2006.  2. Echocardiogram April 2008.  Ejection fraction 55-65%.  3. Bicuspid aortic valve, moderate aortic stenosis with mean gradient      20 mmHG, moderate to severe aortic insufficiency.  4. Questionable patent foramen by TEE November 2007.  5. History of bacterial endocarditis 1984 with possible recurrence of      bacterial endocarditis November 2007.  Apparently, his blood      cultures grew out microaerophilic strep at that time of admission      in 2007.  The patient was discharged home on IV Ceftin.  6. History of hypertension.  No longer requiring medical therapy.   CONSULT:  This admission include infectious disease, the patient seen by  Dr. Ninetta Lights.   PROCEDURES:  This admission include a 2-D echocardiogram and a  transesophageal echocardiogram with direct current cardioversion.   HOSPITAL COURSE:  Alexander Payne is a 60 year old patient of Dr. Fabio Bering  with known history of bicuspid aortic valve and history of subacute  bacterial endocarditis, with a possible need for a surgical correction  of valve disorder in the next 3-5 years.   The patient presented to the  emergency room on day of admission, complaining of 2 days of diffuse  myalgias and flu-like symptoms.  Temperature recorded at 103, prior to  his arrival in the ER.  The patient felt his symptoms were somewhat were  reminiscent of his previous bouts of bacterial endocarditis.  The  patient was admitted to rule out SBE.  He was started on empiric  vancomycin and gentamicin, after blood cultures were obtained.  On the  fourth, after receiving a dose of vancomycin, the patient's skin was  noted be blotchy, and he was short of breath.  It was felt the  vancomycin was causing a reaction.  Vancomycin was discontinued.  Dr.  Eden Emms saw the patient the same day.  Patient afebrile, blood pressure  stable 103/82, no rash at that time.  Proceeded with echocardiogram and  asked infectious disease to consult.  On the 5th, the patient went into  atrial fib, treated with Cardizem and heparin.  A 2-D echocardiogram  performed, EF 55-60%, not possible to be definitive regarding the  presence or absence of vegetation, in the setting of the abnormal valve.  The patient was noted to have mild to moderate AVR.  Estimated aortic  valve area by V-max was 1.48.  It was decided to proceed with TEE with  cardioversion for further evaluation and possible elimination of atrial  arrhythmia.  The patient taken to endoscopy lab on June 02, 2007 by Dr.  Eden Emms.  He converted with 150 joules and then returned to atrial fib,  re-shocked again at 150 joules, no cardioversion to sinus rhythm at that  time.  TEE showed no subacute bacterial endocarditis.  Dr. Eden Emms to see  the patient on the day of discharge.  The patient afebrile, back in  normal sinus rhythm at a rate of 70, blood pressure stable at 110/50,  hematocrit 36.  The patient being discharged home, should follow up with  Dr. Eden Emms.   MEDICATIONS AT TIME OF DISCHARGE:  1. Aspirin 325 mg daily.  2. Toprol XL 25 mg daily.  3.  Amiodarone 200 mg p.o. b.i.d.   Follow up with Dr. Eden Emms 2-3 weeks.  I have called the office and left  a message for them to arrange appointment and notify the patient.  The  patient is instructed return to the emergency room, if he experiences  any further episodes of symptoms associated with his atrial fib,  including with not limited to chest pain, palpitations, fast heart rate,  shortness of breath, lightheadedness, dizziness, pre-syncope or syncopal  episodes.   Duration of discharge encounter greater than 30 minutes.      Dorian Pod, ACNP      Noralyn Pick. Eden Emms, MD, [facility]  Electronically Signed    MB/MEDQ  D:  06/03/2007  T:  06/03/2007  Job:  [id number]   cc:   Tinnie Gens A. Tawanna Cooler, MD

## 2011-05-11 NOTE — Assessment & Plan Note (Signed)
[facility]                            CARDIOLOGY OFFICE NOTE   NAME:Cervantez, RONNEY HONEYWELL                    MRN:          [id number]  DATE:12/04/2007                            DOB:          Mar 18, 1951    Damain returns today for followup. He has a history of PAF, hypertension  and aortic insufficiency.   His last echo was in April 2008.   He was hospitalized in June for febrile illness. At that time, we did a  TEE on him and there was no evidence of significant vegetations. He had  significant aortic insufficiency with mild AS.   He has mild aortic root dilatation. 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   In talking to the patient, he continues to be asymptomatic. He does not  have any recurrent palpitations, there has been no PND or orthopnea, no  lower extremity edema or syncope.   In the past, he used to be on Lisinopril 20/25. Apparently this was  stopped by Dr. Tawanna Cooler for low blood pressures at home. Marshall is an  intelligent individual and takes his blood pressure at home. He says it  has been running 130-140 systolic and he consistently runs 150 here in  our office. Given his aortic insufficiency, I think he should be back on  an ACE inhibitor. We will try him on lisinopril 5 a day.   Also I talked to him at length about cutting back his amiodarone to once  a day. He had some elevated LFTs in the hospital in June and we will  have to check these as well as the thyroid and pulmonary tests with  DLCO. We will cut him back to 200 once a day.   REVIEW OF SYSTEMS:  Otherwise negative.   CURRENT MEDICATIONS:  1. Aspirin a day.  2. Toprol 25 a day.  3. Lisinopril 5 a day to be added.  4. Amiodarone 200 a day to be decreased from b.i.d.  He uses [facility] on [place].   PHYSICAL EXAMINATION:  GENERAL:  Remarkable for a healthy-appearing,  middle-age, white male in no distress. He has fairly extensive beard and  facial hair.  VITAL SIGNS:  Weight is 236. Blood pressure is 155/67, pulse 58 and  regular. Afebrile. Respiratory rate 14.  HEENT:  Unremarkable. Carotids brisk with a  shutter. No JVP elevation,  lymphadenopathy or thyromegaly.  LUNGS:  Clear with diaphragmatic motion. No wheezing.  HEART:  There is an S1, S2 with a systolic ejection murmur of AS and an  aortic insufficiency murmur. PMI is increased but not displaced.  ABDOMEN:  Benign. There is no AAA, no hepatosplenomegaly, no  hepatojugular reflux.  EXTREMITIES:  Distal pulses are intact. No edema.  NEUROLOGIC:  Nonfocal. No muscular weakness.  SKIN:  Warm and dry.   EKG shows sinus bradycardia. Cannot rule out anterior wall MI.   IMPRESSION:  1. Aortic stenosis and aortic insufficiency. Followup echo in January.      Check LV sizes, currently well compensated.  2. Hypertension in the setting of aortic insufficiency. Add low-dose  lisinopril. Followup blood pressures at home.  3. History of paroxysmal atrial fibrillation currently maintaining      sinus rhythm. No indication for anticoagulation. Continue beta      blocker.  4. Health maintenance, needs to followup with dentist. Explanation for      oral hygiene explained.   The patient will see me back in 6 months as long as his echo in January  does not show any abnormalities. He will also check his liver tests,  thyroid functions, and DLCO after cutting back his amiodarone to once a  day.     Noralyn Pick. Eden Emms, MD, [facility]  Electronically Signed    PCN/MedQ  DD: 12/04/2007  DT: 12/04/2007  Job #: [id number]

## 2011-05-11 NOTE — Assessment & Plan Note (Signed)
[place] [facility]                            CARDIOLOGY OFFICE NOTE   NAME:Snowden, NAYTHEN HEIKKILA                    MRN:          [id number]  DATE:03/29/2008                            DOB:          [birth year]    HISTORY:  Mr. Ekstein is seen today in followup.  He has severe aortic  stenosis and is asymptomatic.  We have been following him.  There is no  documented coronary disease.  His last Myoview however was in 2002.  His  last echo in January showed a peak gradient of 74 mmHg and a mean  gradient of 39.  His LV function was normal.  He did have moderate LVH.   I had a lengthy discussion with Rob today regarding his diagnosis.  He  is asymptomatic.  He does have a bit of difficulty with his exercise due  to knee problems.  He has also had PAF and required TEE and got  cardioversion last year.  He has been stable on amiodarone without  recurrence.  He is currently on aspirin not Coumadin.   REVIEW OF SYSTEMS:  Otherwise negative.   MEDICATIONS:  1. Include lisinopril 5 a day.  2. Amiodarone 200 a day.  3. Toprol 25 a day.  4. Aspirin a day.   ALLERGIES:  He has no known allergies.  He uses [facility] here in  [place].   PHYSICAL EXAMINATION:  GENERAL:  His exam is remarkable for a middle-  aged white male in no distress.  VITAL SIGNS:  Weight is 230, blood pressure is 125/55, pulse is 73 and  regular, afebrile, respiratory rate 14.  HEENT:  Unremarkable.  Carotids have parvus et tardus.  No JVP  elevation, lymphadenopathy or thyromegaly.  LUNGS:  Clear with diaphragmatic motion.  No wheezing.  HEART:  There is an S1 with a severe AS murmur.  S2 is muffled.  No  aortic insufficiency murmur.  PMI normal.  ABDOMEN:  Benign.  Bowel sounds positive.  No AAA and no tenderness.  No  hepatosplenomegaly.  VASCULAR:  Reflexes, pulses intact, no edema.  NEUROLOGICAL:  Nonfocal.  SKIN:  Warm and dry.  MUSCULOSKELETAL:  No muscle weakness.   IMPRESSION:  1. Severe aortic stenosis.  Followup echo in 6 months.  The patient      will have an MRI to assess his aortic root size.  His last echo      suggested that the aortic root diameter was 49 mm.  2. The patient will have a stress test in 6 months.  I told him I am      usually fairly vigilant about making sure there is no coronary      disease in someone with significant valve disease.  He has not had      a stress test in over 6 years.  He is not having chest pain but I      would like to do one for two reasons, one to make sure his      functional capacity is truly normal and that he is asymptomatic on  a treadmill and also to rule out coronary disease.  3. History of PAF (paroxysmal atrial fibrillation).  Continue low dose      amiodarone and aspirin.  No evidence of recurrence.     Noralyn Pick. Eden Emms, MD, [facility]  Electronically Signed    PCN/MedQ  DD: 03/29/2008  DT: 03/29/2008  Job #: [id number]

## 2011-05-11 NOTE — Assessment & Plan Note (Signed)
OFFICE VISIT   BOYCE, KELTNER  DOB:  1951/01/26                                        December 01, 2010  CHART #:  [id number]   HISTORY:  The patient returned to my office today for further discussion  of planned Bentall procedure scheduled for Thursday, December 03, 2010.  I saw him initially on November 24, 2010.  He has decided that he would  like to proceed ahead with surgery prior to the holidays, he said he  does not have to worry about it any longer.  He said that he is feeling  well overall without any chest pain or pressure.  He denies any  exertional dyspnea.   PHYSICAL EXAMINATION:  Blood pressure 132/74, his pulse is 67 and  regular, and respiratory rate is 16, unlabored.  Oxygen saturation on  room air is 96%.  He looks well.  Cardiac exam shows regular rate and  rhythm with a grade 3/6 harsh systolic murmur of aortic stenosis and a 1-  2/6 diastolic murmur of aortic insufficiency.  His lungs are clear.  There is no peripheral edema.   IMPRESSION:  I discussed Bentall procedure with the patient and his  wife.  We discussed the use of a mechanical valve conduit which I feel  would be the best option given his age and the fact that ideally he  requires replacement of his aortic root and aortic valve.  He said that  he did not have any problems with taking anticoagulation and felt that  he could be compliant with an anticoagulation regimen.  He and his wife  are in agreement with using a mechanical valve.  I also discussed the  possibility that if the calcification of the annuloventricular junction  is too severe that we may decide to leave his aorta alone and just  replace his aortic valve.  I told him I would make that decision in the  operating room.  His aortic root does have a maximum diameter of about  4.8 cm at the sinus level which is up slightly from 4.5 cm in 2009.  I  discussed the benefits and risks of surgery including, but not  limited  to bleeding, blood transfusion, infection, stroke, myocardial  infarction, heart block requiring permanent pacemaker, anastomotic  coronary  stenosis, organ dysfunction, and death.  He understands all this and is  in agreement to proceed on Thursday.   Evelene Croon, M.D.  Electronically Signed   BB/MEDQ  D:  12/01/2010  T:  12/02/2010  Job:  [id number]

## 2011-05-14 NOTE — Assessment & Plan Note (Signed)
[facility]                              CARDIOLOGY OFFICE NOTE   NAME:Alexander Payne, Alexander Payne                    MRN:          [id number]  DATE:10/27/2006                            DOB:          1951/10/16    [name] is seen today in followup.  He has a history of moderate aortic  insufficiency, hypertension, and previous endocarditis.   He had workup for syncope including a normal heart cath.   His EF is normal.   He has been doing well.   REVIEW OF SYSTEMS:  Remarkable for no significant chest pain, palpitations,  recurrent syncope, or lower extremity edema.   He is currently taking:  1. Aspirin daily.  2. Lisinopril.  3. Hydrochlorothiazide.   His exam is remarkable for skin being warm and dry.  HEENT:  Normal.  There is no thyromegaly.  There is no lymphadenopathy.  LUNGS:  Clear.  Carotids are normal.  There is an S1 with an S4 gallop.  He has an aortic insufficiency murmur and  a systolic ejection murmur.  ABDOMEN:  Benign.  Lower extremities intact pulses. No edema.  NEUROLOGIC:  Nonfocal.   He does not have a widened pulse pressure with a pressure of 118/64.  His  pulse is 78 and regular.  EKG is normal.   IMPRESSION:  Stable aortic insufficiency with mild AS.  Left ventricle  dimensions with mild dilatation and hypertrophy.  Followup echocardiogram in  6 months.  Continue subacute bacterial endocarditis prophylaxis.  Overall, I  think [name] is doing well.  He knows to call us if he should have any  significant recurrent syncope, chest pain, or signs of volume overload.    ______________________________  Noralyn Pick. Eden Emms, MD, [facility]    PCN/MedQ  DD: 10/27/2006  DT: 10/27/2006  Job #: [id number]

## 2011-05-14 NOTE — Cardiovascular Report (Signed)
NAME:  [name], [name]             ACCOUNT NO.:  [id number]   MEDICAL RECORD NO.:  [id number]          PATIENT TYPE:  INP   LOCATION:  3741                         FACILITY:  [facility]   PHYSICIAN:  Charlton Haws, M.Alexander.     DATE OF BIRTH:  1951-04-03   DATE OF PROCEDURE:  DATE OF DISCHARGE:                              CARDIAC CATHETERIZATION   INDICATIONS:  Syncope known aortic valve disease with aortic insufficiency,  abnormal Myoview suggesting inferior wall scar. Catheterization was done  from right femoral artery and vein, 7-French sheath was used in the vein 6-  French sheath  was used in the artery.   Left main coronary artery was a very short segment or there was side-by-side  ostia to the LAD circ, left anterior descending artery was normal.   Circumflex coronary artery was normal.   Right coronary was dominant and normal.   We had some difficulty crossing the aortic valve, which is obviously  calcified and probably bicuspid.  We were able to cross it with a stiff J-  wire in the right coronary artery. There is no significant gradient on  pullback with an LV pressure of 132/11 and aortic pressure of 131/47.  Wide  pulse pressure indicated severe aortic insufficiency.   LAO aortic root injection confirmed severe aortic insufficiency,  angiographic grade 4/4 with the LV opacifying within one and a half beats.  There is no aortic dissection and minimal aortic root dilatation.   The ventriculogram seen after the aortic insufficiency showed good left  ventricular function.   Right heart pressure was done to assess hemodynamics and LV compensation of  the severe aortic insufficiency. Mean RA pressure was 10. RV pressure was  24/10. PA pressures 21/15.  Mean pulmonary-capillary pressure is 20, LV  pressure is 133/11. Aortic pressure is 131/47.   IMPRESSION:  The patient has had known aortic valve disease since the age of  58.  He had a recent echo in the office which did not  show severe left  ventricular enlargement or decreased LV function.  Payne believe he has severe  aortic insufficiency which is currently well compensated in regards to  ventricular performance. The aortic root showed no evidence of dissection.   I do not think that the aortic insufficiency was related to his episode of  syncope or TIA. He has no significant coronary artery disease and his  filling pressures were normal. Payne will discharge him home for follow-up on  medical therapy including an ACE inhibitor. He will have a follow-up echo  again in 3 months. He has good dentition and sees a dentist twice a year and  follows SB prophylaxis   While he was in the hospital., he has not had any significant arrhythmias  and his baseline EKG shows no evidence of significant conduction  abnormality.           ______________________________  Charlton Haws, M.Alexander.     PN/MEDQ  Alexander:  08/17/2005  T:  08/17/2005  Job:  [id number]

## 2011-05-14 NOTE — H&P (Signed)
NAME:  Alexander Payne, Alexander Payne NO.:  [id number]   MEDICAL RECORD NO.:  [id number]          PATIENT TYPE:  EMS   LOCATION:  ED                           FACILITY:  [facility]   PHYSICIAN:  Eugenio Hoes. Tawanna Cooler, MD    DATE OF BIRTH:  09-14-1951   DATE OF ADMISSION:  11/08/2006  DATE OF DISCHARGE:                                HISTORY & PHYSICAL   HOSPITALIST:  Raenette Rover. Felicity Coyer, MD   This 60 year old married male is being admitted to the hospital for  evaluation of fever, chills for 1 week plus.   The patient states he felt well until approximately 7-8 days ago when he  began having fever and chills.  His temperature climbed to 102 and then it  declined.  This happens 2 or 3 times a day.  There does not seem to be any  particular pattern to the fever and chills.  He has had no other symptoms  except for the last 24 hours he had a couple of episodes of loose bowel  movements.  Otherwise, he has felt well.  He came to the office with that  history and was sent to the emergency room for evaluation and diagnostic  studies, essentially admitted to rule out SBE.   The patient has not been in contact with anybody who had been sick in the  last couple of weeks.  He did have endocarditis in 1984 and he has a history  of aortic insufficiency.  He saw Dr. Eden Emms a couple of weeks ago for  cardiac evaluation.  At that time his cardiac exam was negative.  Dr. Eden Emms  told him he would do an echo in the spring of 2008.  He has not had any  foreign travel.  He does not recall being in the woods, being bitten by  ticks, etc.  He did have routine dental work done on October 24, 2006.  At  that time he took four 500 mg amoxicillin tablets before the routine dental  check-ups.   REVIEW OF SYSTEMS:  Otherwise negative.   PAST MEDICAL HISTORY:  He has had an appendectomy.  SBE as noted above.  He  also has mild hypertension, controlled with Zestril 20 mg daily.  He also  takes an aspirin tablet  every day.  Other than that, no major illnesses or  injuries.  No medicine allergies.   He does not smoke and only drinks an occasional drink of alcohol.   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  He gets regular dental care.  CARDIOPULMONARY:  See above.  GI:  Negative.  GU:  Negative.  ENDOCRINE:  Negative.  Weight steady.  MUSCULOSKELETAL:  Negative.  VASCULAR:  Negative.  He does have allergic  rhinitis that is seasonal.  The rest of the review of systems is as noted  above, negative.   SOCIAL HISTORY:  He is married and [place] in [place]  He  works at [employer].  He has 2 children, a boy and a girl in  good health, no problems.   FAMILY HISTORY:  Dad is in his 59s, alive and well except that he had a  partial pneumonectomy for lung cancer, ex-smoker, but survived.  Mother is  in her 51s, alive and well.  No brothers, no sisters.   VACCINATION HISTORY:  Last tetanus 2004.   PHYSICAL EVALUATION:  VITAL SIGNS:  Height was 5 feet 10 inches, weight 210.  BP was 97/48.  Pulse was 93 and regular.  Respirations 12 and regular.  Temperature was 101.2.  GENERAL:  This is a well-developed, well-nourished white male in no acute  distress.  HEENT:  Examination of the head, ears, eyes, nose and throat was negative.  NECK:  Supple.  Thyroid was not enlarged.  CHEST:  Clear to auscultation.  CARDIAC:  AI murmur,  unchanged.  ABDOMEN:  Negative.  EXTREMITIES:  Normal skin, normal peripheral pulses.  No petechiae, etc.,  nail beds.   LABORATORY DATA:  A normal EKG, chest x-ray, CBC, CMP and UA, except he has  some bilirubin in his urine.   IMPRESSION:  1. Fever, chills for one week plus.  Admit, rule out subacute bacterial      endocarditis.  2. History of hypertension, mild, controlled with Zestril 20 mg daily.  3. Status post subacute  bacterial endocarditis.  4. Status post appendectomy.  5. 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   The patient will be admitted and we will get the blood cultures now and at 3  o'clock and 5 o'clock.  Will start him on IV fluids, not on antibiotics at  this juncture until we get the blood cultures or more definitive.  Will also  get a rheumatoid factor and a transthoracic echo for evaluation.  Parenthetically, I consulted over the phone with Rollene Rotunda, MD, who  concurred with admission and evaluation as noted above.           ______________________________  Eugenio Hoes. Tawanna Cooler, MD     JAT/MEDQ  D:  11/08/2006  T:  11/08/2006  Job:  [id number]   cc:   Noralyn Pick. Eden Emms, MD, [facility]  [address]  [address]  [place]  [place] [address]

## 2011-05-14 NOTE — Discharge Summary (Signed)
NAME:  Alexander Payne, Alexander Payne             ACCOUNT NO.:  [id number]   MEDICAL RECORD NO.:  [id number]          PATIENT TYPE:  INP   LOCATION:  1412                         FACILITY:  [facility]   PHYSICIAN:  Stacie Glaze, MD    DATE OF BIRTH:  [birth year]   DATE OF ADMISSION:  11/08/2006  DATE OF DISCHARGE:  11/12/2006                                 DISCHARGE SUMMARY   ADMISSION DIAGNOSES:  1. Subacute bacterial endocarditis.  2. History of prior endocarditis in 1984.   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   HISTORY OF PRESENT ILLNESS:  The patient is a 60 year old white male who is  admitted for a fever of unknown origin, probable bacterial endocarditis with  a 1 week history of fever and chills following dental work. He was given an  amoxicillin prophylaxis prior to his dental work and apparently failed that  prophylaxis. Blood cultures were positive x4 for microaerophilic strep.  Initially, he was on vancomycin until these cultures were fully identified,  then he was converted to Rocephin 1 gram IV daily. He remained afebrile  during this hospitalization. A transesophageal echocardiogram was obtained  during this admission and it revealed that his aortic valve was thickened  and calcified and functionally bicuspid. There was some significant scarring  the posterior commissure, possibly secondary to the prior endocarditis, with  mild aortic insufficiency. No definite vegetation was seen. No abscesses  were noted. A bubble test was performed and there were a few bubbles  scattering in the left atria, consistent with probable patent foramen ovale.   IMPRESSION:  1. Probable bicuspid aortic valve.  2. Possible patent foramen ovale.  3. Subacute bacterial endocarditis, microaerophilic streptococcus.   PLAN:  Discharge home on 4 weeks of 1 gram of Ceftin IV daily. Will repeat  transesophageal echocardiogram prior to the end of his  therapy and now for  prophylaxis for all invasive procedures in dental work, 2 gram of Ceftin IM  1 hour prior to procedure, per recommendation of Infectious Disease.      Stacie Glaze, MD  Electronically Signed     JEJ/MEDQ  D:  11/12/2006  T:  11/12/2006  Job:  [id number]   cc:   Tinnie Gens A. Tawanna Cooler, MD  [address] [place]  [place] [address]

## 2011-05-14 NOTE — H&P (Signed)
NAME:  Alexander Payne, Alexander Payne NO.:  [id number]   MEDICAL RECORD NO.:  [id number]          PATIENT TYPE:  INP   LOCATION:  1824                         FACILITY:  [facility]   PHYSICIAN:  Verne Grain, MD   DATE OF BIRTH:  Aug 06, 1951   DATE OF ADMISSION:  08/15/2005  DATE OF DISCHARGE:                                HISTORY & PHYSICAL   PRIMARY CARDIOLOGIST:  Charlton Haws, M.D.   PRIMARY CARE PHYSICIAN:  Tinnie Gens A. Tawanna Cooler, M.D.   CHIEF COMPLAINTS:  Syncope.   HISTORY OF PRESENT ILLNESS:  A 60 year old male with hypertension,  borderline hyperlipidemia (diet/exercise controlled), a long history of a  murmur since age 48 (question bicuspid aortic valve?), remote history of  endocarditis, quit tobacco (2005).  He was in usual state of health going  for his daily walk this evening.  He came back to relax in the recliner,  became hungry and decided to get up and go to the kitchen; subsequently had  a fall.  It was unusual for the patient falling.  He does have some knee  trouble; he thought that perhaps his knee had buckled and given out on  him.  His wife called him to ask what happened. He said that his knee  buckled and he just fell. She walked into the room to check his status;  she saw him getting up, take a few additional steps and then suddenly  collapsed with rolling back in his head.  She reports no trauma with the  fall, just that he slumped to the ground, falling onto the linoleum; he  didn't hit anything hard.  The wife subsequently went to the patient and  found him to be breathing but unconscious.  She immediately called 9-1-1.  She returned to her husband's side and asked questions, to which he did  respond but spoke in gibberish initially.  His mentation, however, quickly  improved and he was able to assist in his transfer to a nearby chair.  His  speech subsequently was normal, with normal interactions with his wife.  His  wife did report the patient passed  gas with the fall, but had no bladder  incontinence and no overt bowel incontinence beyond flatus. The wife reports  that during the time the patient was speaking gibberish his eyes were  glazed over; however, once in the chair he was again speaking normally and  reports feeling fine. The patient has no respiratory complaints. He had no  chest pain or palpitations at the time.  He maintained consciousness. There  was additional fall and felt like the fall was secondary to his knee  buckling under him; however, he does not recall the events of the second  fall, stating that he remembers getting up and continuing to walk through  the kitchen.  He reports the next thing he remembers was looking up at his  wife standing over him. No other complaints. The patient had orthostatics  checked in the emergency room, in which were found to be negative. There  were no orthostatic symptoms. He had an EKG that revealed normal sinus  rhythm with no changes diagnostic of ischemia. He had cardiac markers drawn  that were negative x1, electrolytes that were essentially normal, and CBC  that was normal.   ALLERGIES/ADVERSE REACTIONS:  No known drug allergies.   CURRENT MEDICATIONS:  1.  Lisinopril plus hydrochlorothiazide 20 mg/25 mg 1 tablet p.o. daily.  2.  Aspirin 81 mg p.o. daily.  3.  Multivitamin 1 tablet p.o. daily.   PAST MEDICAL HISTORY:  1.  History of a murmur since the age of 33 years of old (question bicuspid      aortic valve/aortic stenosis; with an ultrasound in the clinic 2-3      months ago per patient.  (Report not available on the E-chart).  2.  Remote history of endocarditis approximately 23 years ago, treated      medically.  3.  Hypertension.  4.  Borderline hyperlipidemia, diet controlled.  5.  Quit tobacco October 31, 2004.  6.  History of degenerative joint disease with knee trouble.   SOCIAL HISTORY:  The patient lives in [place] with his wife. He is  married. He has  two daughters. 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  He  quit smoking in November 2005.  He consumes approximately 3 beers/day. He  reports no illicit drug use.   FAMILY HISTORY:  The patient's mother is alive at age 59, with early  dementia.  The patient's father is alive at age 35, status post  pneumonectomy for lung cancer in the setting of tobacco use. The patient has  no siblings.   REVIEW OF SYSTEMS:  No recent fevers, chills, weight change or adenopathy.  No acute auditory or visual changes, no rashes or other skin lesions  reported. No chest pain, shortness of breath, dyspnea on exertion,  orthopnea, PND, edema, claudication, cough or wheezing.  Syncope was  reported, as described in the HPI. No bowel or bladder complaints, other  than the passing flatus with the syncopal event.  He has no focal weakness  or numbness. He has no nausea, vomiting or diarrhea. No recent change in  bowel habits. No polyuria or polydipsia. No heat or cold intolerance.  No  skin or hair changes.  He has no endocrinologic abnormality. All other  systems are negative.   PHYSICAL EXAMINATION:  VITAL SIGNS:  Temperature 98.7, heart rate 91,  respiratory rate 20, blood pressure 102/47 lying down.  The patient's blood  pressure was 111/53 with a heart rate of 84.  On standing up the patient's  blood pressure was 102/47 with a heart rate of 91.  HEENT:  The patient was normocephalic, atraumatic with extraocular movements  intact.  Oropharynx was pink and moist without lesions.  NECK:  Supple. There was no jugular venous distension.  Detection of carotid  bruits was complicated by a systolic ejection murmur that radiated to the  neck; heard well throughout the neck as well as the precordium, and even  into the abdomen. CARDIOVASCULAR: There was a regular S1 and a regular S2; loudest at the  right upper sternal border with  apparent preservation of S2 and no obvious  delay in peaking of the murmur. No delay or diminishment in carotid pulses  appreciated.  LUNGS:  Fields clear to auscultation bilaterally.  SKIN:  Examination with no evidence of rash.  ABDOMEN:  Soft, nontender, nondistended. Positive bowel sounds.  LOWER EXTREMITIES:  Admission reveals no evidence of  edema. Distal pulses  are 2+ and symmetric bilaterally.  NEUROLOGIC: Exam is grossly nonfocal. The patient is alert and oriented,  answers questions appropriately, moves all four extremities without  difficulty. There are no focal motor or sensory deficits appreciated.   EKG:  Sinus rhythm at a rate of 83; with normal axis, normal PR, QRS and QT  intervals.  There are  Q-waves noted in T1 and T2. There is poor R-wave  progression, but no changes diagnostic of ischemia.  There is no clear  evidence of left ventricular hypertrophy.   LABORATORY VALUES:  White blood cell count 9.3, hematocrit 39, platelet  count 354. Sodium 135, potassium 4.1, chloride 102, bicarb 24, BUN 12,  creatinine 0.9, glucose 127, total bilirubin 0.6.  AST 16, ALT 22, alkaline  phosphatase 89, total protein 6.5, albumin 3.7.  CK-MB 1.0, troponin-I less  than 0.05, PTT 28, INR 1.0, myoglobin 85.   IMPRESSION AND PLAN:  A 60 year old male with hypertension, former smoker,  diet-controlled hyperlipidemia, remote history of endocarditis and  longstanding murmur since age 12 years old (question of bicuspid  aortic/aortic stenosis ?).  He reportedly had an outpatient echocardiogram  within the past  2-3 months that was presumably stable.  He now presents  with syncope of unclear etiology. The patient has no symptoms of ischemia.  EKG showed no changes diagnostic of ischemia. The patient has no respiratory  complaints. No palpitations. Orthostatics are negative. Initial laboratory  evaluation negative, including one set of negative cardiac markers.   1.  SYNCOPE.  Again,exact  etiology is unclear; however, it  appears that the      event was fairly dramatic.  The patient is relatively young in age, and      has had no similar episodes before. The presence of his murmur clearly      raises the question of a syncopal event related to aortic stenosis,      although his physical examination does not clearly identify signs of      critical aortic stenosis.  His recent echocardiogram within the past 2-3      months was reportedly okay (per patient) make the diagnosis of syncope      secondary to valvular disease somewhat less likely. However, will      request a repeat echocardiogram to further assess the patient's valvular      heart disease and reconsider the etiology of the patient's syncopal      event -- which does sound suspicious for a cardiac etiology. We will      monitor the patient on telemetry while we rule out myocardial infarction     with serial cardiac markers and EKGs. We will check a TSH, free T4, a      urinalysis and obtain a chest x-ray PA and lateral. I will obtain a head      CT to rule out intracranial hemorrhage or obvious cerebrovascular      accident. We will obtain an echocardiogram to assess ejection fraction      and assess the aortic valve -- specifically to look for evidence of      significant aortic stenosis. I will also order a bilateral carotid      ultrasound, as his carotid auscultation is difficult secondary to      radiation of his systolic ejection murmur. If the above evaluation is      negative, consider an adenosine Cardiolite to rule out significant      ischemia (  in light of risk factors of hypertension, former tobacco use      in a 60 year old male and borderline hyperlipidemia), plus/minus a head      MRI, MRA; and, discharge with a loop monitor to evaluate for any      evidence of symptomatic arrhythmia.  2.  HYPERTENSION. Continue lisinopril and hydrochlorothiazide as previously      prescribed to obtain a goal blood  pressure of less than 135/85.  3.  BORDERLINE HYPERLIPIDEMIA. Will check lipid profile in the morning to      assess how well the patient's lipids are controlled with his current      diet and exercise regimen.  4.  Remote history of endocarditis.  The patient will need endocarditis      prophylaxis p.r.n.; should he undergo any invasive procedures or      endocarditis would be indicated. Endocarditis prophylaxis is indicated.           ______________________________  Verne Grain, MD     DDH/MEDQ  D:  08/15/2005  T:  08/15/2005  Job:  [id number]   cc:   Charlton Haws, M.D.  [address]  [address]  [place]  [place] [address]   Eugenio Hoes. Tawanna Cooler, M.D. [facility]  [address] [place]  [place] [address]

## 2011-05-14 NOTE — Assessment & Plan Note (Signed)
[facility]                            CARDIOLOGY OFFICE NOTE   NAME:Alexander Payne, Alexander Payne                    MRN:          [id number]  DATE:03/29/2007                            DOB:          07/14/1951    Alexander Payne is seen today in followup.  I have followed him in the past  for probable bicuspid aortic valve.   He apparently was hospitalized with recurrent subacute bacterial  endocarditis from November 13th through the 17th.  He had a  transesophageal echocardiogram, as far as I can tell, I was not involved  his care.  He was on our primary care service.  He thinks that Dr. Cliffton Asters consulted on him.   He had multiple questions today.  I actually sat down with Molly Maduro and  went over his echocardiogram with him.  In looking at his  echocardiogram, he does appear to have a bicuspid valve.  He appears to  have had some progression of his aortic valve disease.  He now has  moderate aortic stenosis and moderate aortic insufficiency.  His peak  velocity is in the 3.5 to 4 M per second range.   In our office, we had an echocardiogram from May 2006, and only had mild  aortic stenosis at the time.   In reviewing his echo here in the office, I explained to Willet that the  2nd bout of endocarditis in November may have changed the natural  history of his disease.  I suspect that he will need something done for  his valve in the next 3 to 5 years.   He is currently asymptomatic.  I questioned him closely in regards to  any shortness of breath, syncope, or chest pain.  He clearly has not had  any.  He has mild to moderate LV cavity dilatation with mild LVH.   Andray asked me specifically about SB prophylaxis.  Given his history of  recurrent endocarditis, I suspect that he definitely would need  endocarditis in the past.  He has only taken amoxicillin.  He indicated  that someone during his hospitalization told he would need a shot prior  to any  dental work.  He does have some dental work coming up in the next  month or so.  I will have him follow up with Dr. Cliffton Asters prior to  this.  I do not know if he needs a shot of ceftriaxone or some other  therapy that is more aggressive than routine SB prophylaxis.   His current medications include an aspirin a day.  He had been on  Altace, and this was stopped in the hospital.  I suspect it may have  been because of progression of his aortic stenosis.   REVIEW OF SYSTEMS:  Remarkable for no recurrent fever or chills.  No  skin stigmata of SVE.  No headaches.  No dyspnea.  No lower extremity  edema.  10 point ROS otherwise negative   His blood pressure today is 118/64.  Pulse pressure is not widened.  Carotids are prominent, but not particularly bisferiens.  LUNGS:  Clear.  There is an S1 and S2 with an aortic stenosis and an aortic  insufficiency murmur.  His 2nd heart sound is preserved.  Distal pulses are intact with no edema.  HEENT normal   IMPRESSION:  Recent recurrent sinus bradycardia in the hospital in  November.  We will have the patient see Dr. Cliffton Asters for further  recommendations in regards any unique sinus bradycardia prophylaxis that  he will need.  I clearly think that he needs prophylaxis despite the new  guidelines.  He has had 2 bouts of endocarditis.  I will see him back in  6 months.  He knows to call us if he has any symptoms referable to his  heart.  It would be nice to have Deval get into his 47s before needing  a valve replacement.  At that point, he may be considered for a tissue  valve.   We will have to watch his blood pressure since his lisinopril has been  stopped.   Again, I spent quite a bit of time with Joal today answering all of  his questions, going over the natural history of bicuspid aortic valve  disease and trying to decipher his hospital records in regards to what  he actually needs for SB prophylaxis.  We also reviewed his   echocardiogram together, and he currently understands issues regarding  his bicuspid valve, and 2 bouts of endocarditis.     Noralyn Pick. Eden Emms, MD, [facility]  Electronically Signed    PCN/MedQ  DD: 03/29/2007  DT: 03/29/2007  Job #: [id number]

## 2011-05-14 NOTE — Discharge Summary (Signed)
NAME:  Alexander Payne, Alexander Payne NO.:  [id number]   MEDICAL RECORD NO.:  [id number]          PATIENT TYPE:  INP   LOCATION:  3741                         FACILITY:  [facility]   PHYSICIAN:  Charlton Haws, M.D.     DATE OF BIRTH:  January 21, 1951   DATE OF ADMISSION:  08/15/2005  DATE OF DISCHARGE:  08/18/2005                                 DISCHARGE SUMMARY   PRIMARY CARE PHYSICIAN:  Tinnie Gens A. Tawanna Cooler, M.D.   PRIMARY CARDIOLOGIST:  Charlton Haws, M.D.   DISCHARGE DIAGNOSES:  One syncopal episode with unknown etiology.   PAST MEDICAL HISTORY:  1.  Endocarditis 23 years ago.  2.  Hypertension, controlled with medications.  3.  Aortic valve disease.  4.  Asymptomatic questionable congenital abnormality, evaluated for surgery      by Dr. Sheliah Plane.  No need for surgery at this time.   PROCEDURE:  1.  A cardiac catheterization on August 17, 2005, by Dr. Eden Emms.  No      significant coronary artery disease.  Filling pressures were normal.  2.  A Myoview study on August 16, 2005:  No stress-induced ischemia.      Ejection fraction of 57%.  3.  An MRI of the head on August 16, 2005:  Negative findings.  4.  A CT of the head on August 15, 2005:  Negative.   DISPOSITION:  The patient is being discharged home with instructions to  continue his previous medications.   DISCHARGE MEDICATIONS:  1.  Aspirin 81 mg.  2.  Multivitamin.  3.  Lisinopril/hydrochlorothiazide 20/25 mg daily.   FOLLOWUP:  1.  He is to follow up for a post-catheterization check on August 27, 2005, at 3 p.m.  2.  To follow up with Dr. Eden Emms on September 23, 2005, at 12:15 p.m.   HISTORY OF PRESENT ILLNESS:  Alexander Payne is a 60 year old Caucasian  gentleman with no known history of coronary artery disease, a history of  atrial valve disease, asymptomatic, and mild hypertension, controlled with  medication, who presented on the day of admission complaining of a syncopal  episode x1.  It appears the  syncopal episode was somewhat dramatic,  according to his wife, who was present when it occurred.  His wife found the  patient to be breathing but unconscious.  She called 911.  The patient does  not recall any of the episode.   HOSPITAL COURSE:  He was admitted for further evaluation.  His cardiac  enzymes were negative.  Baseline blood work without any abnormalities.   PHYSICAL EXAMINATION:  Unremarkable.   Electrocardiogram:  Normal sinus rhythm without arrhythmias.   Alexander Payne experienced no further episodes of syncope.  He tolerated the  cardiac catheterization and stress test without any complications.  An MRI  and CT of the head without any diagnostic causes for syncope.  Alexander Payne  is being discharged home, to follow up with Dr. Eden Emms as stated above.      Dorian Pod, NP    ______________________________  Charlton Haws, M.D.    MB/MEDQ  D:  08/18/2005  T:  08/18/2005  Job:  [id number]   cc:   Charlton Haws, M.D.  [address]  [address]  [place]  [place] [address]   Eugenio Hoes. Tawanna Cooler, M.D. [facility]  [address] [place]  [place] [address]

## 2011-05-21 ENCOUNTER — Ambulatory Visit (INDEPENDENT_AMBULATORY_CARE_PROVIDER_SITE_OTHER): Payer: BC Managed Care – PPO | Admitting: *Deleted

## 2011-05-21 DIAGNOSIS — Q231 Congenital insufficiency of aortic valve: Secondary | ICD-10-CM

## 2011-05-21 DIAGNOSIS — I4891 Unspecified atrial fibrillation: Secondary | ICD-10-CM

## 2011-05-21 LAB — POCT INR: INR: 4.5

## 2011-05-28 ENCOUNTER — Ambulatory Visit (INDEPENDENT_AMBULATORY_CARE_PROVIDER_SITE_OTHER): Payer: BC Managed Care – PPO | Admitting: *Deleted

## 2011-05-28 DIAGNOSIS — Q231 Congenital insufficiency of aortic valve: Secondary | ICD-10-CM

## 2011-05-28 DIAGNOSIS — I4891 Unspecified atrial fibrillation: Secondary | ICD-10-CM

## 2011-05-28 LAB — POCT INR: INR: 1.9

## 2011-06-01 ENCOUNTER — Telehealth: Payer: Self-pay | Admitting: Cardiovascular Disease

## 2011-06-01 NOTE — Telephone Encounter (Signed)
They need approval for meds to continue coming from [facility] they have spoken with patient and he is fine with it just need ok from Dr

## 2011-06-02 NOTE — Telephone Encounter (Signed)
Left message for pharm okay with [place] Deliah Goody

## 2011-06-07 ENCOUNTER — Ambulatory Visit (INDEPENDENT_AMBULATORY_CARE_PROVIDER_SITE_OTHER): Payer: BC Managed Care – PPO | Admitting: *Deleted

## 2011-06-07 DIAGNOSIS — I4891 Unspecified atrial fibrillation: Secondary | ICD-10-CM

## 2011-06-07 DIAGNOSIS — Q231 Congenital insufficiency of aortic valve: Secondary | ICD-10-CM

## 2011-06-11 ENCOUNTER — Encounter: Payer: BC Managed Care – PPO | Admitting: *Deleted

## 2011-06-17 ENCOUNTER — Encounter: Payer: BC Managed Care – PPO | Admitting: *Deleted

## 2011-06-28 ENCOUNTER — Ambulatory Visit (INDEPENDENT_AMBULATORY_CARE_PROVIDER_SITE_OTHER): Payer: BC Managed Care – PPO | Admitting: *Deleted

## 2011-06-28 DIAGNOSIS — Q231 Congenital insufficiency of aortic valve: Secondary | ICD-10-CM

## 2011-06-28 DIAGNOSIS — I4891 Unspecified atrial fibrillation: Secondary | ICD-10-CM

## 2011-06-28 LAB — POCT INR: INR: 2.4

## 2011-07-23 ENCOUNTER — Ambulatory Visit (INDEPENDENT_AMBULATORY_CARE_PROVIDER_SITE_OTHER): Payer: BC Managed Care – PPO | Admitting: *Deleted

## 2011-07-23 DIAGNOSIS — Q231 Congenital insufficiency of aortic valve: Secondary | ICD-10-CM

## 2011-07-23 DIAGNOSIS — I4891 Unspecified atrial fibrillation: Secondary | ICD-10-CM

## 2011-07-23 LAB — POCT INR: INR: 2.8

## 2011-08-04 ENCOUNTER — Other Ambulatory Visit: Payer: Self-pay | Admitting: *Deleted

## 2011-08-04 ENCOUNTER — Other Ambulatory Visit: Payer: Self-pay | Admitting: Pharmacist

## 2011-08-04 MED ORDER — WARFARIN SODIUM 2.5 MG PO TABS: ORAL_TABLET | ORAL | Status: DC

## 2011-08-04 MED ORDER — METOPROLOL TARTRATE 25 MG PO TABS: 25.0000 mg | ORAL_TABLET | Freq: Two times a day (BID) | ORAL | Status: DC

## 2011-08-05 ENCOUNTER — Other Ambulatory Visit: Payer: Self-pay | Admitting: Pharmacist

## 2011-08-05 MED ORDER — WARFARIN SODIUM 2.5 MG PO TABS: ORAL_TABLET | ORAL | Status: DC

## 2011-08-11 ENCOUNTER — Other Ambulatory Visit: Payer: Self-pay | Admitting: Cardiology

## 2011-08-11 MED ORDER — WARFARIN SODIUM 2.5 MG PO TABS: ORAL_TABLET | ORAL | Status: DC

## 2011-08-11 NOTE — Telephone Encounter (Signed)
Med refill coumadin

## 2011-08-20 ENCOUNTER — Ambulatory Visit (INDEPENDENT_AMBULATORY_CARE_PROVIDER_SITE_OTHER): Payer: BC Managed Care – PPO | Admitting: *Deleted

## 2011-08-20 DIAGNOSIS — I4891 Unspecified atrial fibrillation: Secondary | ICD-10-CM

## 2011-08-20 DIAGNOSIS — Q231 Congenital insufficiency of aortic valve: Secondary | ICD-10-CM

## 2011-09-17 ENCOUNTER — Ambulatory Visit (INDEPENDENT_AMBULATORY_CARE_PROVIDER_SITE_OTHER): Payer: BC Managed Care – PPO | Admitting: *Deleted

## 2011-09-17 DIAGNOSIS — I4891 Unspecified atrial fibrillation: Secondary | ICD-10-CM

## 2011-09-17 DIAGNOSIS — Q231 Congenital insufficiency of aortic valve: Secondary | ICD-10-CM

## 2011-10-14 LAB — BASIC METABOLIC PANEL
BUN: 6
Chloride: 101
GFR calc non Af Amer: >60
Glucose, Bld: 106 — ABNORMAL HIGH
Potassium: 3.5
Potassium: 3.6
Sodium: 135

## 2011-10-14 LAB — CULTURE, BLOOD (ROUTINE X 2)
Culture: NO GROWTH
Culture: NO GROWTH
Culture: NO GROWTH

## 2011-10-14 LAB — URINALYSIS, ROUTINE W REFLEX MICROSCOPIC
Glucose, UA: NEGATIVE
Hgb urine dipstick: NEGATIVE
Ketones, ur: 15 — AB
Leukocytes, UA: NEGATIVE
Nitrite: NEGATIVE
Protein, ur: 30 — AB
Specific Gravity, Urine: 1.036 — ABNORMAL HIGH
Urobilinogen, UA: 1
pH: 6

## 2011-10-14 LAB — URINE MICROSCOPIC-ADD ON

## 2011-10-14 LAB — CBC
HCT: 36 — ABNORMAL LOW
HCT: 38 — ABNORMAL LOW
HCT: 39.3
Hemoglobin: 12.7 — ABNORMAL LOW
Hemoglobin: 13.2
Hemoglobin: 13.9
MCHC: 35.3
MCV: 97.1
MCV: 97.6
Platelets: 151
Platelets: 176
Platelets: 214
RBC: 4.05 — ABNORMAL LOW
RDW: 12.9
WBC: 4.8
WBC: 5.4
WBC: 6.1

## 2011-10-14 LAB — DIFFERENTIAL
Basophils Absolute: 0
Basophils Relative: 1
Eosinophils Absolute: 0
Eosinophils Relative: 0
Lymphocytes Relative: 31
Lymphs Abs: 1.5
Monocytes Absolute: 0.5
Monocytes Relative: 11
Neutro Abs: 2.7
Neutrophils Relative %: 57

## 2011-10-14 LAB — COMPREHENSIVE METABOLIC PANEL
ALT: 71 — ABNORMAL HIGH
AST: 56 — ABNORMAL HIGH
Albumin: 3.4 — ABNORMAL LOW
Alkaline Phosphatase: 83
CO2: 24
Chloride: 100
Creatinine, Ser: 0.91
GFR calc Af Amer: >60
GFR calc non Af Amer: >60
Potassium: 3.6
Total Bilirubin: 1

## 2011-10-14 LAB — COMPREHENSIVE METABOLIC PANEL WITH GFR
BUN: 13
Calcium: 8.4
Glucose, Bld: 122 — ABNORMAL HIGH
Sodium: 132 — ABNORMAL LOW
Total Protein: 6.6

## 2011-10-14 LAB — APTT: aPTT: 34

## 2011-10-14 LAB — URINE CULTURE
Colony Count: NO GROWTH
Culture: NO GROWTH

## 2011-10-14 LAB — PROTIME-INR
INR: 1.2
Prothrombin Time: 15

## 2011-10-14 LAB — SEDIMENTATION RATE: Sed Rate: 13

## 2011-10-15 ENCOUNTER — Ambulatory Visit (INDEPENDENT_AMBULATORY_CARE_PROVIDER_SITE_OTHER): Payer: BC Managed Care – PPO | Admitting: *Deleted

## 2011-10-15 DIAGNOSIS — Q231 Congenital insufficiency of aortic valve: Secondary | ICD-10-CM

## 2011-10-15 DIAGNOSIS — I4891 Unspecified atrial fibrillation: Secondary | ICD-10-CM

## 2011-11-05 ENCOUNTER — Other Ambulatory Visit: Payer: Self-pay | Admitting: *Deleted

## 2011-11-05 ENCOUNTER — Ambulatory Visit (INDEPENDENT_AMBULATORY_CARE_PROVIDER_SITE_OTHER): Payer: BC Managed Care – PPO | Admitting: *Deleted

## 2011-11-05 DIAGNOSIS — Q231 Congenital insufficiency of aortic valve: Secondary | ICD-10-CM

## 2011-11-05 DIAGNOSIS — I4891 Unspecified atrial fibrillation: Secondary | ICD-10-CM

## 2011-11-05 MED ORDER — WARFARIN SODIUM 2.5 MG PO TABS: 2.5000 mg | ORAL_TABLET | ORAL | Status: DC

## 2011-11-08 ENCOUNTER — Other Ambulatory Visit: Payer: Self-pay

## 2011-11-08 MED ORDER — DIGOXIN 250 MCG PO TABS: 250.0000 ug | ORAL_TABLET | Freq: Every day | ORAL | Status: DC

## 2011-11-08 NOTE — Telephone Encounter (Signed)
.   Requested Prescriptions   Pending Prescriptions Disp Refills  . digoxin (LANOXIN) 0.25 MG tablet 30 tablet 11    Sig: Take 1 tablet (250 mcg total) by mouth daily.

## 2011-11-10 ENCOUNTER — Telehealth: Payer: Self-pay | Admitting: Cardiovascular Disease

## 2011-11-10 ENCOUNTER — Other Ambulatory Visit: Payer: Self-pay | Admitting: *Deleted

## 2011-11-10 NOTE — Telephone Encounter (Signed)
New problem  Per Meghan patient had allergic reaction to pre med on last two cleaning. Pt is schedule for cleaning on 11/15 - broken tooth. Please advise on what pre- med patient should take.

## 2011-11-10 NOTE — Telephone Encounter (Signed)
Spoke with Dr. Shirlee Latch can use clindamycin for dental work. Dr. Randa Spike office notified.

## 2011-11-11 ENCOUNTER — Other Ambulatory Visit: Payer: Self-pay

## 2011-11-11 MED ORDER — DIGOXIN 250 MCG PO TABS: 250.0000 ug | ORAL_TABLET | Freq: Every day | ORAL | Status: DC

## 2011-11-12 ENCOUNTER — Encounter: Payer: BC Managed Care – PPO | Admitting: *Deleted

## 2011-11-23 ENCOUNTER — Other Ambulatory Visit: Payer: Self-pay | Admitting: *Deleted

## 2011-11-23 MED ORDER — WARFARIN SODIUM 2.5 MG PO TABS: 2.5000 mg | ORAL_TABLET | ORAL | Status: DC

## 2011-11-26 ENCOUNTER — Ambulatory Visit (INDEPENDENT_AMBULATORY_CARE_PROVIDER_SITE_OTHER): Payer: BC Managed Care – PPO | Admitting: *Deleted

## 2011-11-26 DIAGNOSIS — I4891 Unspecified atrial fibrillation: Secondary | ICD-10-CM

## 2011-11-26 DIAGNOSIS — Q231 Congenital insufficiency of aortic valve: Secondary | ICD-10-CM

## 2011-12-06 ENCOUNTER — Encounter: Payer: Self-pay | Admitting: Cardiovascular Disease

## 2011-12-06 ENCOUNTER — Encounter: Payer: Self-pay | Admitting: *Deleted

## 2011-12-06 ENCOUNTER — Ambulatory Visit (INDEPENDENT_AMBULATORY_CARE_PROVIDER_SITE_OTHER): Payer: BC Managed Care – PPO | Admitting: Cardiovascular Disease

## 2011-12-06 ENCOUNTER — Ambulatory Visit (INDEPENDENT_AMBULATORY_CARE_PROVIDER_SITE_OTHER): Payer: BC Managed Care – PPO | Admitting: *Deleted

## 2011-12-06 DIAGNOSIS — I4891 Unspecified atrial fibrillation: Secondary | ICD-10-CM

## 2011-12-06 DIAGNOSIS — Q231 Congenital insufficiency of aortic valve: Secondary | ICD-10-CM

## 2011-12-06 DIAGNOSIS — I10 Essential (primary) hypertension: Secondary | ICD-10-CM

## 2011-12-06 LAB — POCT INR: INR: 2.6

## 2011-12-06 NOTE — Assessment & Plan Note (Signed)
S/P Bental with normal exam  Clindimycin for SBE  Echo post op looked good

## 2011-12-06 NOTE — Assessment & Plan Note (Signed)
Well controlled.  Continue current medications and low sodium Dash type diet.    

## 2011-12-06 NOTE — Patient Instructions (Signed)
Your physician wants you to follow-up in: 6  MONTHS WITH DR Haywood Filler will receive a reminder letter in the mail two months in advance. If you don't receive a letter, please call our office to schedule the follow-up appointment. Your physician has recommended you make the following change in your medication: STOP AMIODARONE AND DIGOXIN .

## 2011-12-06 NOTE — Assessment & Plan Note (Signed)
Maint NSR  Stop digoxen and amiodarone

## 2011-12-06 NOTE — Progress Notes (Signed)
The patient returned to my office today for followup status post [place]  procedure on December 03, 2010. He did well initially postoperatively  but then was readmitted on December 11, 2010, with sudden development of  weakness and shortness of breath and was diagnosed with a pericardial  tamponade. 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  Had an uncomplicated course after the pericardial window, he was  discharged home. INR;s have been Rx  Echo 12/29/10 with normal valve EF 60% moderate LVH and no effusion  ROS: Denies fever, malais, weight loss, blurry vision, decreased visual acuity, cough, sputum, SOB, hemoptysis, pleuritic pain, palpitaitons, heartburn, abdominal pain, melena, lower extremity edema, claudication, or rash.  All other systems reviewed and negative  General: Affect appropriate Healthy:  appears stated age HEENT: normal Neck supple with no adenopathy JVP normal no bruits no thyromegaly Lungs clear with no wheezing and good diaphragmatic motion Heart:  S1/S2 clicke SEM  no ,rub, gallop or click PMI normal Abdomen: benighn, BS positve, no tenderness, no AAA no bruit.  No HSM or HJR Distal pulses intact with no bruits No edema Neuro non-focal Skin warm and dry No muscular weakness   Current Outpatient Prescriptions  Medication Sig Dispense Refill  . amiodarone (PACERONE) 200 MG tablet Take 200 mg by mouth 2 (two) times daily.        . digoxin (LANOXIN) 0.25 MG tablet Take 1 tablet (250 mcg total) by mouth daily.  90 tablet  3  . metoprolol tartrate (LOPRESSOR) 25 MG tablet Take 1 tablet (25 mg total) by mouth 2 (two) times daily.  60 tablet  3  . Multiple Vitamin (MULTIVITAMIN) capsule Take 1 capsule by mouth daily.        Marland Kitchen warfarin (COUMADIN) 2.5 MG tablet Take 1 tablet (2.5 mg total) by mouth as directed.  105 tablet  1    Allergies  Amoxicillin  Electrocardiogram:  Assessment and  Plan

## 2011-12-14 ENCOUNTER — Encounter: Payer: BC Managed Care – PPO | Admitting: *Deleted

## 2011-12-14 ENCOUNTER — Ambulatory Visit: Payer: BC Managed Care – PPO | Admitting: Cardiovascular Disease

## 2011-12-27 ENCOUNTER — Other Ambulatory Visit: Payer: Self-pay | Admitting: *Deleted

## 2011-12-27 MED ORDER — WARFARIN SODIUM 2.5 MG PO TABS: 2.5000 mg | ORAL_TABLET | ORAL | Status: DC

## 2011-12-29 ENCOUNTER — Other Ambulatory Visit: Payer: Self-pay | Admitting: Pharmacist

## 2011-12-29 MED ORDER — WARFARIN SODIUM 2.5 MG PO TABS: 2.5000 mg | ORAL_TABLET | ORAL | Status: DC

## 2012-01-03 ENCOUNTER — Ambulatory Visit (INDEPENDENT_AMBULATORY_CARE_PROVIDER_SITE_OTHER): Payer: BC Managed Care – PPO | Admitting: *Deleted

## 2012-01-03 DIAGNOSIS — I4891 Unspecified atrial fibrillation: Secondary | ICD-10-CM

## 2012-01-03 DIAGNOSIS — Q231 Congenital insufficiency of aortic valve: Secondary | ICD-10-CM

## 2012-01-03 NOTE — Patient Instructions (Signed)
Leafy green vegetables in moderation and consistency 

## 2012-01-07 ENCOUNTER — Other Ambulatory Visit: Payer: Self-pay | Admitting: Cardiovascular Disease

## 2012-01-07 MED ORDER — METOPROLOL TARTRATE 25 MG PO TABS: 12.5000 mg | ORAL_TABLET | Freq: Two times a day (BID) | ORAL | Status: DC

## 2012-02-04 ENCOUNTER — Ambulatory Visit (INDEPENDENT_AMBULATORY_CARE_PROVIDER_SITE_OTHER): Payer: BC Managed Care – PPO

## 2012-02-04 DIAGNOSIS — I4891 Unspecified atrial fibrillation: Secondary | ICD-10-CM

## 2012-02-04 DIAGNOSIS — Q231 Congenital insufficiency of aortic valve: Secondary | ICD-10-CM

## 2012-02-04 LAB — POCT INR: INR: 2

## 2012-02-08 ENCOUNTER — Encounter: Payer: Self-pay | Admitting: Internal Medicine

## 2012-02-28 ENCOUNTER — Other Ambulatory Visit (INDEPENDENT_AMBULATORY_CARE_PROVIDER_SITE_OTHER): Payer: BC Managed Care – PPO

## 2012-02-28 ENCOUNTER — Telehealth: Payer: Self-pay

## 2012-02-28 ENCOUNTER — Ambulatory Visit (INDEPENDENT_AMBULATORY_CARE_PROVIDER_SITE_OTHER): Payer: BC Managed Care – PPO | Admitting: Internal Medicine

## 2012-02-28 ENCOUNTER — Encounter: Payer: Self-pay | Admitting: Internal Medicine

## 2012-02-28 VITALS — BP 142/68 | HR 60 | Ht 76.0 in | Wt 229.0 lb

## 2012-02-28 DIAGNOSIS — Z01818 Encounter for other preprocedural examination: Secondary | ICD-10-CM

## 2012-02-28 DIAGNOSIS — D649 Anemia, unspecified: Secondary | ICD-10-CM

## 2012-02-28 DIAGNOSIS — Z7901 Long term (current) use of anticoagulants: Secondary | ICD-10-CM

## 2012-02-28 DIAGNOSIS — Z952 Presence of prosthetic heart valve: Secondary | ICD-10-CM

## 2012-02-28 DIAGNOSIS — Z1211 Encounter for screening for malignant neoplasm of colon: Secondary | ICD-10-CM

## 2012-02-28 DIAGNOSIS — Z954 Presence of other heart-valve replacement: Secondary | ICD-10-CM

## 2012-02-28 LAB — CBC WITH DIFFERENTIAL/PLATELET
Basophils Relative: 0.6 % (ref 0.0–3.0)
Eosinophils Relative: 2.3 % (ref 0.0–5.0)
Lymphocytes Relative: 25.5 % (ref 12.0–46.0)
MCV: 100.1 fl — ABNORMAL HIGH (ref 78.0–100.0)
Monocytes Absolute: 0.6 10*3/uL (ref 0.1–1.0)
Monocytes Relative: 8 % (ref 3.0–12.0)
Neutrophils Relative %: 63.6 % (ref 43.0–77.0)
RBC: 4.16 Mil/uL — ABNORMAL LOW (ref 4.22–5.81)
WBC: 7.7 10*3/uL (ref 4.5–10.5)

## 2012-02-28 MED ORDER — PEG-KCL-NACL-NASULF-NA ASC-C 100 G PO SOLR: 1.0000 | Freq: Once | ORAL | Status: DC

## 2012-02-28 NOTE — Telephone Encounter (Signed)
Patient informed that Dr. Eden Emms said it would be ok to hold his coumadin 3 days prior to colonoscopy. Patient verbalized understanding.

## 2012-02-28 NOTE — Telephone Encounter (Signed)
3 days off coumadin ok

## 2012-02-28 NOTE — Telephone Encounter (Signed)
02/28/2012    RE: Alexander Payne DOB: [birth year] MRN: [id number]   Dear Dr Eden Emms,    We have scheduled the above patient for an endoscopic procedure. Our records show that he is on anticoagulation therapy.   Please advise as to how long the patient may come off his therapy of Warfariior to the procedure, which is scheduled for 03/13/12.  Please fax back/ or route the completed form to Field Staniszewski [place], CMA at [phone].   Sincerely,  [place], Clayborne Dana

## 2012-02-28 NOTE — Telephone Encounter (Signed)
NOTE ROUTED BACK TO PATTI [place]  CMA./CY

## 2012-02-28 NOTE — Telephone Encounter (Signed)
Dr Leone Payor recommends 3 days off coumadin for procedure. Is this okay?

## 2012-02-28 NOTE — Progress Notes (Signed)
Subjective:    Patient ID: Alexander Payne, male    DOB: 1951-11-13, 61 y.o.   MRN: [id number]  HPI This is a pleasant man without any GI symptoms. He is here to discuss a screening colonoscopy. He takes warfarin for an aortic valve replacement. He is followed by Dr. Eden Emms. Dr. Tawanna Cooler is his primary care physician.  Allergies  Allergen Reactions  . Amoxicillin    Outpatient Prescriptions Prior to Visit  Medication Sig Dispense Refill  . metoprolol tartrate (LOPRESSOR) 25 MG tablet Take 0.5 tablets (12.5 mg total) by mouth 2 (two) times daily. Or as directed  90 tablet  3  . Multiple Vitamin (MULTIVITAMIN) capsule Take 1 capsule by mouth daily.        Marland Kitchen warfarin (COUMADIN) 2.5 MG tablet Take 1 tablet (2.5 mg total) by mouth as directed.  105 tablet  2   Past Medical History  Diagnosis Date  . PAROXYSMAL ATRIAL FIBRILLATION   . HYPERTENSION   . BICUSPID AORTIC VALVE   . AORTIC STENOSIS   . ANEURYSM, THORACIC AORTIC   . ELECTROCARDIOGRAM, ABNORMAL   . 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 Cardiac catheterization   . Appendectomy   . Ascending aortic aneurysm repair w/ mechanical aortic valve replacement 2011    29 mm St. Jude with conduit graft - dr. Laneta Simmers  . Pericardial window 2011    post valve replacement    History   Social History  . Marital Status: Married    Spouse Name: N/A    Number of Children: 2  . Years of Education: N/A   Occupational History  . Retired    Social History Main Topics  . Smoking status: Former Games developer  . Smokeless tobacco: Never Used  . Alcohol Use: Yes     2 daily   . Drug Use: No  .     Other Topics Concern      Social History Narrative   Occ caffeine recently moved to a one story home. Wife has had a stroke previously.    Family History  Problem Relation Age of Onset  . Colon cancer Neg Hx         Review of Systems All other review of systems negative at this time as well.    Objective:   Physical Exam General:  NAD Eyes:   anicteric Lungs:  clear Heart:  S1 with a metallic S2 no rubs, murmurs or gallops Abdomen:  soft and nontender, BS+ Ext:   Trace bilateral lower extremity edema, some brown hyperpigmentation of the pretibial area    Lab Results  Component Value Date   WBC 11.9* 12/16/2010   HGB 9.0* 12/16/2010   HCT 27.0* 12/16/2010   MCV 97.5 12/16/2010   PLT 517* 12/16/2010             Assessment & Plan:   1. Special screening for malignant neoplasms, colon   2. Warfarin anticoagulation   3. Preoperative examination, unspecified   4. Aortic valve replaced   5. Anemia, unspecified    He is an appropriate candidate for a screening colonoscopy. The risks benefits and indications have been explained to the patient he understands and agrees to proceed. Alternative such a CT colonoscopy, sigmoidoscopy in routine annual Hemoccults are discussed as well. It is best practice to hold his warfarin prior to the colonoscopy I think. In general aortic valves are a low risk lesions or stroke and we  can hold the warfarin for 3-5 days, without any other anticoagulation. I will ask his cardiologist, Dr. Eden Emms if it is acceptable old is warfarin for 3 days without any Lovenox bridge. I've explain this process to the patient as well. He does not need prophylactic antibiotics.  Chart review shows he is not had a CBC since his hemopericardium problems. He was anemic at that time. He is pink and does not look anemic now but we will check a CBC for completeness. This has no bearing on the colonoscopy indications since it was a pre-existing issue, and was related to postoperative bleeding back then. I have also advised him to return to primary care for routine followup as well.  I will copy Dr. Tawanna Cooler and Dr. Eden Emms

## 2012-02-28 NOTE — Patient Instructions (Addendum)
Your physician has requested that you go to the basement for the following lab work before leaving today: CBC You have been scheduled for a colonoscopy. Please follow written instructions given to you at your visit today.  Please pick up your prep kit at the pharmacy within the next 1-3 days. You will be contaced by our office prior to your procedure for directions on holding your Coumadin/Warfarin.  If you do not hear from our office 1 week prior to your scheduled procedure, please call [phone] to discuss.

## 2012-03-02 ENCOUNTER — Encounter: Payer: Self-pay | Admitting: Gastroenterology

## 2012-03-03 ENCOUNTER — Ambulatory Visit (INDEPENDENT_AMBULATORY_CARE_PROVIDER_SITE_OTHER): Payer: BC Managed Care – PPO | Admitting: *Deleted

## 2012-03-03 DIAGNOSIS — I4891 Unspecified atrial fibrillation: Secondary | ICD-10-CM

## 2012-03-03 DIAGNOSIS — Q231 Congenital insufficiency of aortic valve: Secondary | ICD-10-CM

## 2012-03-03 LAB — POCT INR: INR: 2

## 2012-03-13 ENCOUNTER — Ambulatory Visit (AMBULATORY_SURGERY_CENTER): Payer: BC Managed Care – PPO | Admitting: Internal Medicine

## 2012-03-13 ENCOUNTER — Encounter: Payer: Self-pay | Admitting: Internal Medicine

## 2012-03-13 VITALS — BP 152/90 | HR 55 | Temp 97.2°F | Resp 18 | Ht 76.0 in | Wt 229.0 lb

## 2012-03-13 DIAGNOSIS — K635 Polyp of colon: Secondary | ICD-10-CM

## 2012-03-13 DIAGNOSIS — D128 Benign neoplasm of rectum: Secondary | ICD-10-CM

## 2012-03-13 DIAGNOSIS — Z1211 Encounter for screening for malignant neoplasm of colon: Secondary | ICD-10-CM

## 2012-03-13 DIAGNOSIS — D126 Benign neoplasm of colon, unspecified: Secondary | ICD-10-CM

## 2012-03-13 DIAGNOSIS — D129 Benign neoplasm of anus and anal canal: Secondary | ICD-10-CM

## 2012-03-13 MED ORDER — SODIUM CHLORIDE 0.9 % IV SOLN: 500.0000 mL | INTRAVENOUS | Status: DC

## 2012-03-13 NOTE — Patient Instructions (Signed)
YOU HAD AN ENDOSCOPIC PROCEDURE TODAY AT THE [facility]: Refer to the procedure report that was given to you for any specific questions about what was found during the examination.  If the procedure report does not answer your questions, please call your gastroenterologist to clarify.  If you requested that your care partner not be given the details of your procedure findings, then the procedure report has been included in a sealed envelope for you to review at your convenience later.  YOU SHOULD EXPECT: Some feelings of bloating in the abdomen. Passage of more gas than usual.  Walking can help get rid of the air that was put into your GI tract during the procedure and reduce the bloating. If you had a lower endoscopy (such as a colonoscopy or flexible sigmoidoscopy) you may notice spotting of blood in your stool or on the toilet paper. If you underwent a bowel prep for your procedure, then you may not have a normal bowel movement for a few days.  DIET: Your first meal following the procedure should be a light meal and then it is ok to progress to your normal diet.  A half-sandwich or bowl of soup is an example of a good first meal.  Heavy or fried foods are harder to digest and may make you feel nauseous or bloated.  Likewise meals heavy in dairy and vegetables can cause extra gas to form and this can also increase the bloating.  Drink plenty of fluids but you should avoid alcoholic beverages for 24 hours.  ACTIVITY: Your care partner should take you home directly after the procedure.  You should plan to take it easy, moving slowly for the rest of the day.  You can resume normal activity the day after the procedure however you should NOT DRIVE or use heavy machinery for 24 hours (because of the sedation medicines used during the test).    SYMPTOMS TO REPORT IMMEDIATELY: A gastroenterologist can be reached at any hour.  During normal business hours, 8:30 AM to 5:00 PM Monday through Friday,  call [phone].  After hours and on weekends, please call the GI answering service at [phone] who will take a message and have the physician on call contact you.   Following lower endoscopy (colonoscopy or flexible sigmoidoscopy):  Excessive amounts of blood in the stool  Significant tenderness or worsening of abdominal pains  Swelling of the abdomen that is new, acute  Fever of 100F or higher    FOLLOW UP: If any biopsies were taken you will be contacted by phone or by letter within the next 1-3 weeks.  Call your gastroenterologist if you have not heard about the biopsies in 3 weeks.  Our staff will call the home number listed on your records the next business day following your procedure to check on you and address any questions or concerns that you may have at that time regarding the information given to you following your procedure. This is a courtesy call and so if there is no answer at the home number and we have not heard from you through the emergency physician on call, we will assume that you have returned to your regular daily activities without incident.  SIGNATURES/CONFIDENTIALITY: You and/or your care partner have signed paperwork which will be entered into your electronic medical record.  These signatures attest to the fact that that the information above on your After Visit Summary has been reviewed and is understood.  Full responsibility of the confidentiality   of this discharge information lies with you and/or your care-partner.     

## 2012-03-13 NOTE — Op Note (Signed)
[facility] [address] [place], [place]  [address]  COLONOSCOPY PROCEDURE REPORT  PATIENT:  Alexander Payne, Alexander Payne  MR#:  [id number] BIRTHDATE:  1951-06-25, 60 yrs. old  GENDER:  male ENDOSCOPIST:  Iva Boop, MD, [facility] REF. BY:  Tinnie Gens A. Tawanna Cooler, M.D. PROCEDURE DATE:  03/13/2012 PROCEDURE:  Colonoscopy with snare polypectomy ASA CLASS:  Class II INDICATIONS:  Routine Risk Screening MEDICATIONS:   These medications were titrated to patient response per physician's verbal order, Fentanyl 75 mcg IV, Versed 5 mg IV  DESCRIPTION OF PROCEDURE:   After the risks benefits and alternatives of the procedure were thoroughly explained, informed consent was obtained.  Digital rectal exam was performed and revealed no abnormalities and normal prostate.   The [id number] endoscope was introduced through the anus and advanced to the cecum, which was identified by both the appendix and ileocecal valve, without limitations.  The quality of the prep was excellent, using MoviPrep.  The instrument was then slowly withdrawn as the colon was fully examined. <<PROCEDUREIMAGES>>  FINDINGS:  There were multiple polyps identified and removed. 3 transverse polyps 1cm  (snare cautery) and two diminutive (cold snare) removed. 3 descending polyps 1 cm (snare cautery), 7 mm and diminutive (cold snare) removed. 1 sigmoid, 2 rectal polyps diminutive (5mm) all removed with cold snare. All sent to pathology.  This was otherwise a normal examination of the colon. Retroflexed views in the rectum revealed internal hemorrhoids. The time to cecum = 4:41 minutes. The scope was then withdrawn in 21:49 minutes from the cecum and the procedure completed. COMPLICATIONS:  None ENDOSCOPIC IMPRESSION: 1) Polyps - nine removed today largest 1 cm 2) Internal hemorrhoids 3) Otherwise normal examination - excellent prep RECOMMENDATIONS: 1) Resume warfarin tonight 2) Follow-up in anti-coagulation clinic as  planned REPEAT EXAM:  In for Colonoscopy, pending biopsy results.  Iva Boop, MD, [facility]  CC:  Roderick Pee, MD and The Patient  n. eSIGNED:   Iva Boop at 03/13/2012 04:59 PM  Francena Hanly, [phone]

## 2012-03-13 NOTE — Progress Notes (Signed)
Patient did not have preoperative order for IV antibiotic SSI prophylaxis. (G8918)  Patient did not experience any of the following events: a burn prior to discharge; a fall within the facility; wrong site/side/patient/procedure/implant event; or a hospital transfer or hospital admission upon discharge from the facility. (G8907)  

## 2012-03-14 ENCOUNTER — Telehealth: Payer: Self-pay

## 2012-03-14 NOTE — Telephone Encounter (Signed)
  Follow up Call-  Call back number 03/13/2012  Post procedure Call Back phone  # [phone]  Permission to leave phone message Yes     Patient questions:  Do you have a fever, pain , or abdominal swelling? no Pain Score  0 *  Have you tolerated food without any problems? yes  Have you been able to return to your normal activities? yes  Do you have any questions about your discharge instructions: Diet   no Medications  no Follow up visit  no  Do you have questions or concerns about your Care? no  Actions: * If pain score is 4 or above: No action needed, pain <4.

## 2012-03-20 ENCOUNTER — Encounter: Payer: Self-pay | Admitting: Internal Medicine

## 2012-03-20 DIAGNOSIS — Z860100 Personal history of colon polyps, unspecified: Secondary | ICD-10-CM | POA: Insufficient documentation

## 2012-03-20 NOTE — Progress Notes (Signed)
Quick Note:  9 polyps, 1 sessile serrated adenoma and 6 tub adenomas - largest polyp 1 cm 2 hyperplastic rectal polyps  Repeat colon 3 yrs about 02/2015 ______

## 2012-03-23 ENCOUNTER — Ambulatory Visit (INDEPENDENT_AMBULATORY_CARE_PROVIDER_SITE_OTHER): Payer: BC Managed Care – PPO | Admitting: *Deleted

## 2012-03-23 DIAGNOSIS — Q231 Congenital insufficiency of aortic valve: Secondary | ICD-10-CM

## 2012-03-23 DIAGNOSIS — I4891 Unspecified atrial fibrillation: Secondary | ICD-10-CM

## 2012-04-14 ENCOUNTER — Ambulatory Visit (INDEPENDENT_AMBULATORY_CARE_PROVIDER_SITE_OTHER): Payer: BC Managed Care – PPO

## 2012-04-14 DIAGNOSIS — I4891 Unspecified atrial fibrillation: Secondary | ICD-10-CM

## 2012-04-14 DIAGNOSIS — Q231 Congenital insufficiency of aortic valve: Secondary | ICD-10-CM

## 2012-04-14 LAB — POCT INR: INR: 2.1

## 2012-05-19 ENCOUNTER — Ambulatory Visit (INDEPENDENT_AMBULATORY_CARE_PROVIDER_SITE_OTHER): Payer: BC Managed Care – PPO | Admitting: Pharmacist

## 2012-05-19 DIAGNOSIS — I4891 Unspecified atrial fibrillation: Secondary | ICD-10-CM

## 2012-05-19 DIAGNOSIS — Q231 Congenital insufficiency of aortic valve: Secondary | ICD-10-CM

## 2012-05-19 LAB — POCT INR: INR: 1.9

## 2012-05-31 ENCOUNTER — Ambulatory Visit (INDEPENDENT_AMBULATORY_CARE_PROVIDER_SITE_OTHER): Payer: BC Managed Care – PPO | Admitting: *Deleted

## 2012-05-31 DIAGNOSIS — I4891 Unspecified atrial fibrillation: Secondary | ICD-10-CM

## 2012-05-31 DIAGNOSIS — Q231 Congenital insufficiency of aortic valve: Secondary | ICD-10-CM

## 2012-06-02 ENCOUNTER — Other Ambulatory Visit: Payer: Self-pay | Admitting: Cardiovascular Disease

## 2012-06-14 ENCOUNTER — Ambulatory Visit (INDEPENDENT_AMBULATORY_CARE_PROVIDER_SITE_OTHER): Payer: BC Managed Care – PPO | Admitting: *Deleted

## 2012-06-14 DIAGNOSIS — I4891 Unspecified atrial fibrillation: Secondary | ICD-10-CM

## 2012-06-14 DIAGNOSIS — Q231 Congenital insufficiency of aortic valve: Secondary | ICD-10-CM

## 2012-06-15 ENCOUNTER — Encounter (INDEPENDENT_AMBULATORY_CARE_PROVIDER_SITE_OTHER): Payer: BC Managed Care – PPO | Admitting: Ophthalmology

## 2012-06-15 DIAGNOSIS — H43819 Vitreous degeneration, unspecified eye: Secondary | ICD-10-CM

## 2012-06-15 DIAGNOSIS — H35439 Paving stone degeneration of retina, unspecified eye: Secondary | ICD-10-CM

## 2012-06-15 DIAGNOSIS — H2510 Age-related nuclear cataract, unspecified eye: Secondary | ICD-10-CM

## 2012-06-27 ENCOUNTER — Telehealth: Payer: Self-pay | Admitting: Cardiovascular Disease

## 2012-06-27 NOTE — Telephone Encounter (Signed)
New Problem:     I called the patient and was unable to reach them. I left a message on their voicemail with my name, the reason I called, the name of his physician, and a number to call back to schedule their appointment. 

## 2012-07-13 ENCOUNTER — Ambulatory Visit (INDEPENDENT_AMBULATORY_CARE_PROVIDER_SITE_OTHER): Payer: Self-pay | Admitting: *Deleted

## 2012-07-13 DIAGNOSIS — I4891 Unspecified atrial fibrillation: Secondary | ICD-10-CM

## 2012-07-13 DIAGNOSIS — Q231 Congenital insufficiency of aortic valve: Secondary | ICD-10-CM

## 2012-07-13 LAB — POCT INR: INR: 1.8

## 2012-07-27 ENCOUNTER — Ambulatory Visit (INDEPENDENT_AMBULATORY_CARE_PROVIDER_SITE_OTHER): Payer: PRIVATE HEALTH INSURANCE | Admitting: *Deleted

## 2012-07-27 DIAGNOSIS — I4891 Unspecified atrial fibrillation: Secondary | ICD-10-CM

## 2012-07-27 DIAGNOSIS — Q231 Congenital insufficiency of aortic valve: Secondary | ICD-10-CM

## 2012-08-09 ENCOUNTER — Ambulatory Visit (INDEPENDENT_AMBULATORY_CARE_PROVIDER_SITE_OTHER): Payer: PRIVATE HEALTH INSURANCE | Admitting: *Deleted

## 2012-08-09 ENCOUNTER — Ambulatory Visit (INDEPENDENT_AMBULATORY_CARE_PROVIDER_SITE_OTHER): Payer: PRIVATE HEALTH INSURANCE | Admitting: Cardiovascular Disease

## 2012-08-09 ENCOUNTER — Encounter: Payer: Self-pay | Admitting: Cardiovascular Disease

## 2012-08-09 VITALS — BP 150/84 | HR 63 | Ht 76.0 in | Wt <= 1120 oz

## 2012-08-09 DIAGNOSIS — I4891 Unspecified atrial fibrillation: Secondary | ICD-10-CM

## 2012-08-09 DIAGNOSIS — Q231 Congenital insufficiency of aortic valve: Secondary | ICD-10-CM

## 2012-08-09 NOTE — Patient Instructions (Addendum)
Your physician wants you to follow-up in: YEAR WITH DR NISHAN  You will receive a reminder letter in the mail two months in advance. If you don't receive a letter, please call our office to schedule the follow-up appointment.  Your physician recommends that you continue on your current medications as directed. Please refer to the Current Medication list given to you today. 

## 2012-08-09 NOTE — Assessment & Plan Note (Signed)
Likely related to LVH  Chronic repolarization abnormality in lateral leads  Improved compared to pre AVR

## 2012-08-09 NOTE — Progress Notes (Signed)
Patient ID: Alexander Payne, male   DOB: 1951/11/27, 61 y.o.   MRN: [id number] The patient returned to my office today for followup status post Bentall  procedure on December 03, 2010. He did well initially postoperatively  but then was readmitted on December 11, 2010, with sudden development of  weakness and shortness of breath and was diagnosed with a pericardial  tamponade. 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 Had an uncomplicated course after the pericardial window, he was  discharged home. INR;s have been Rx   Echo 12/29/10 with normal valve EF 60% moderate LVH and no effusion  ROS: Denies fever, malais, weight loss, blurry vision, decreased visual acuity, cough, sputum, SOB, hemoptysis, pleuritic pain, palpitaitons, heartburn, abdominal pain, melena, lower extremity edema, claudication, or rash.  All other systems reviewed and negative  General: Affect appropriate Healthy:  appears stated age HEENT: normal Neck supple with no adenopathy JVP normal no bruits no thyromegaly Lungs clear with no wheezing and good diaphragmatic motion Heart:  S1/S2 no murmur, no rub, gallop or click PMI normal Abdomen: benighn, BS positve, no tenderness, no AAA no bruit.  No HSM or HJR Distal pulses intact with no bruits No edema Neuro non-focal Skin warm and dry No muscular weakness   Current Outpatient Prescriptions  Medication Sig Dispense Refill  . CLINDAMYCIN HCL PO Take by mouth. WILL TAKE BEFORE DENTAL PROCEDURES      . metoprolol tartrate (LOPRESSOR) 25 MG tablet Take 0.5 tablets (12.5 mg total) by mouth 2 (two) times daily. Or as directed  90 tablet  3  . Multiple Vitamin (MULTIVITAMIN) capsule Take 1 capsule by mouth daily.        Marland Kitchen warfarin (COUMADIN) 2.5 MG tablet Take as directed by Anticoagulation clinic  135 tablet  1    Allergies  Amoxicillin  Electrocardiogram:  NSR rate 63 Lateral T wave inversions  Improved T waves compared to ECG dated 04/14/10  Assessment and Plan

## 2012-08-09 NOTE — Assessment & Plan Note (Signed)
Well controlled.  Continue current medications and low sodium Dash type diet.    

## 2012-08-09 NOTE — Assessment & Plan Note (Signed)
S/P AVR 2011  Normal exam  SBE

## 2012-08-09 NOTE — Assessment & Plan Note (Signed)
Resolved.  In setting of AVR with pericardial effusion

## 2012-08-17 IMAGING — CT CT ANGIO CHEST
2 of 6 series · 17 of 46 positions shown · IV contrast (Omnipaque 300)
Comparison: Cardiac MRI 04/16/2008 and chest radiograph
10/23/2010.

CLINICAL DATA: Dilated aortic root seen on cardiac cath.  The
patient is reportedly asymptomatic.  Bicuspid aortic valve and
aortic stenosis, as diagnosed on MRI cardiac 04/16/2008.

CT ANGIOGRAPHY CHEST WITH CONTRAST
TECHNIQUE: Multidetector CT imaging of the chest was performed
using the standard protocol during bolus administration of
intravenous contrast.  Multiplanar CT image reconstructions
including MIPs were obtained to evaluate the vascular anatomy.
Contrast:  95 ml Ymnipaque-G88

[Series 4: cta chest w/cm 3mm · axial · 0.77mm/px · z∈[-106,+178]mm · 14 of 111 slices shown]
[im 8/111  lung]
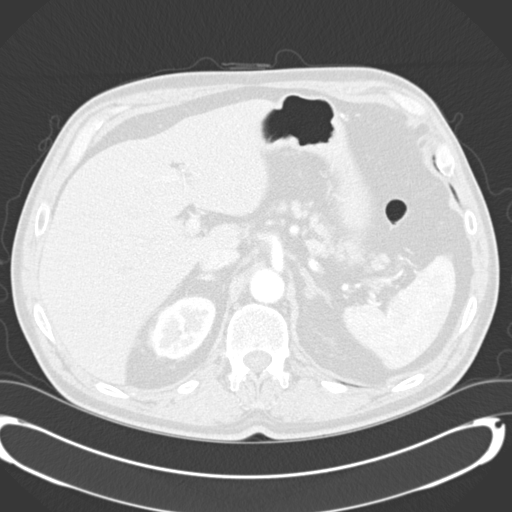
[im 15/111  soft-tissue]
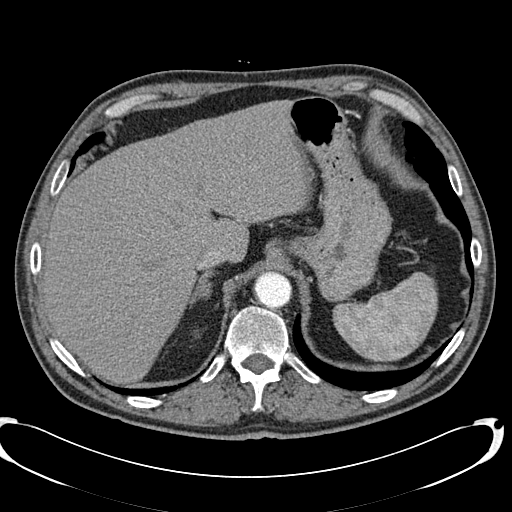
[im 23/111  lung]
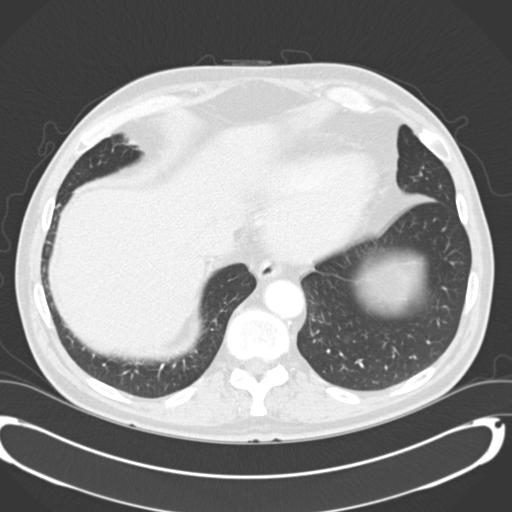
[im 30/111  soft-tissue]
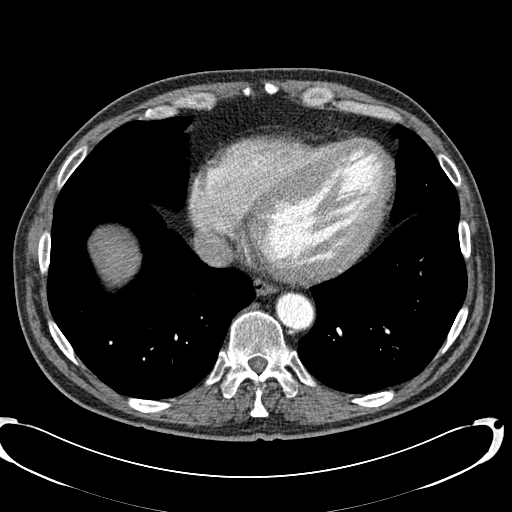
[im 37/111  lung]
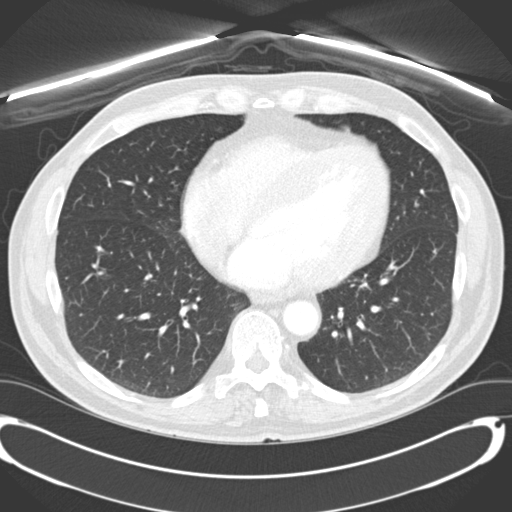
[im 45/111  soft-tissue]
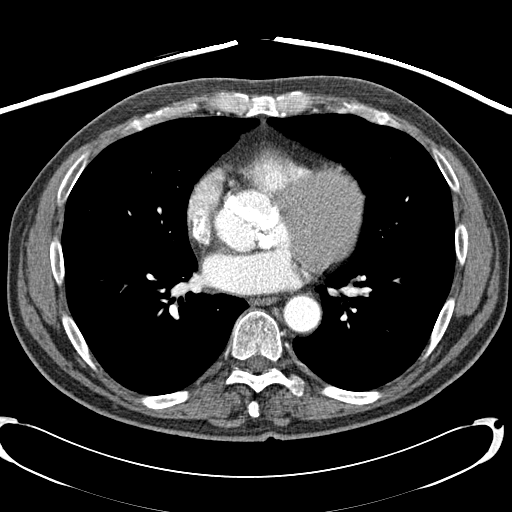
[im 52/111  lung]
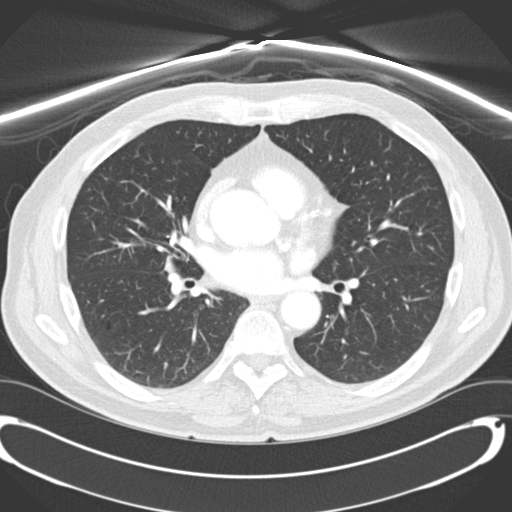
[im 59/111  soft-tissue]
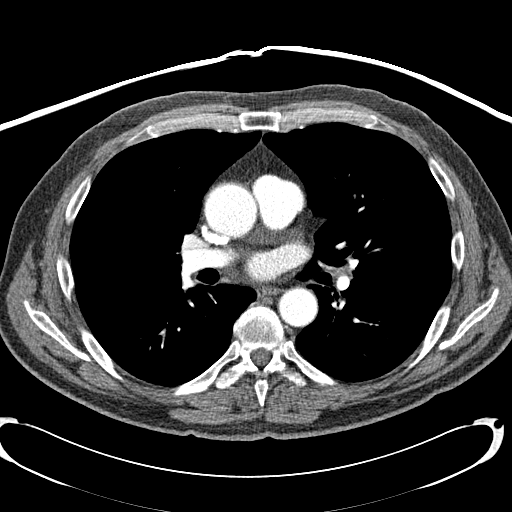
[im 67/111  lung]
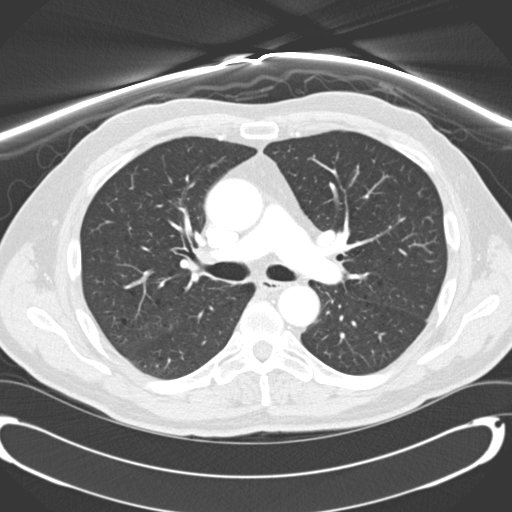
[im 74/111  soft-tissue]
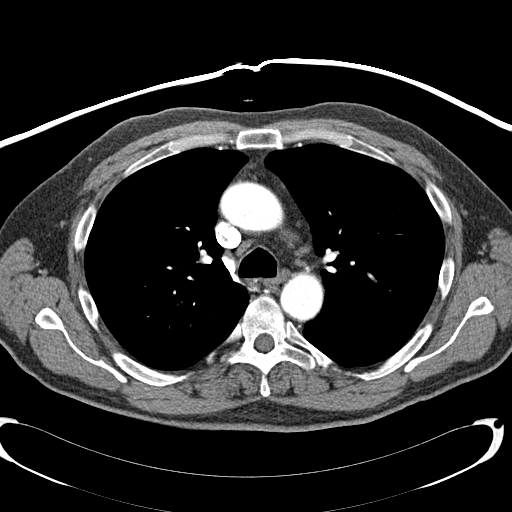
[im 81/111  lung]
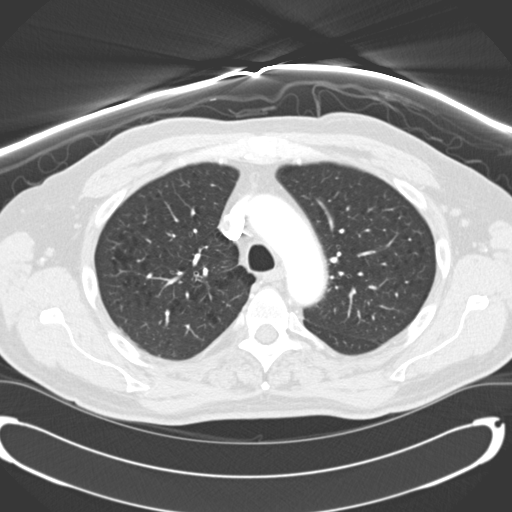
[im 89/111  soft-tissue]
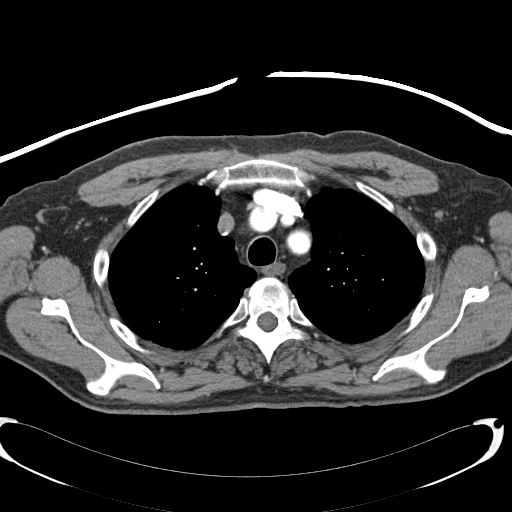
[im 96/111  lung]
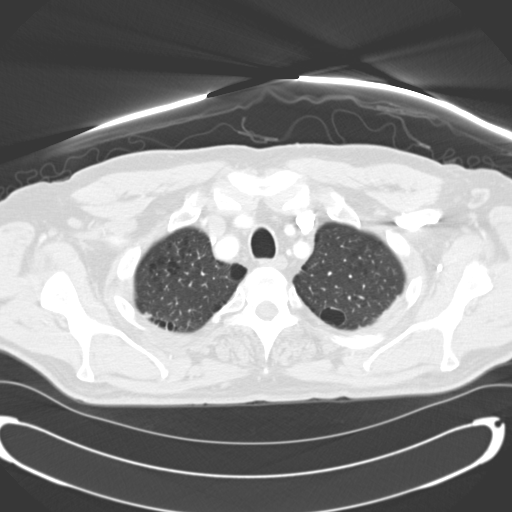
[im 103/111  soft-tissue]
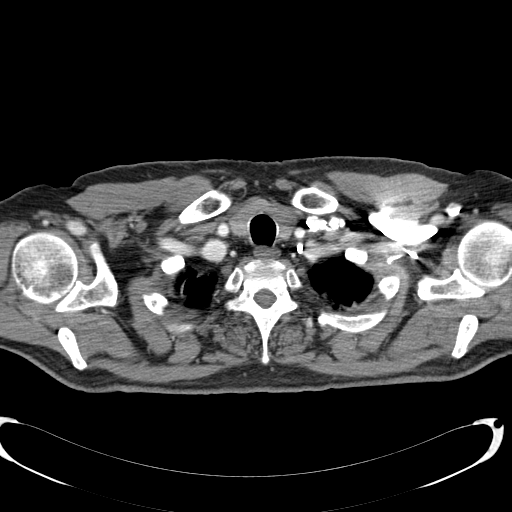

[Series 602: <mpr thick range> · coronal · 0.77mm/px · 3 of 119 slices shown]
[im 30/119  soft-tissue]
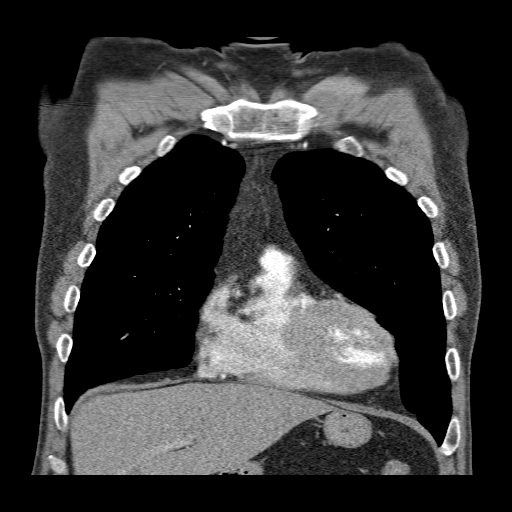
[im 60/119  soft-tissue]
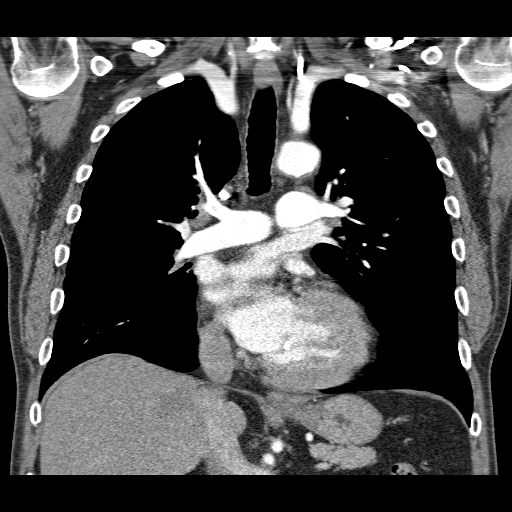
[im 89/119  soft-tissue]
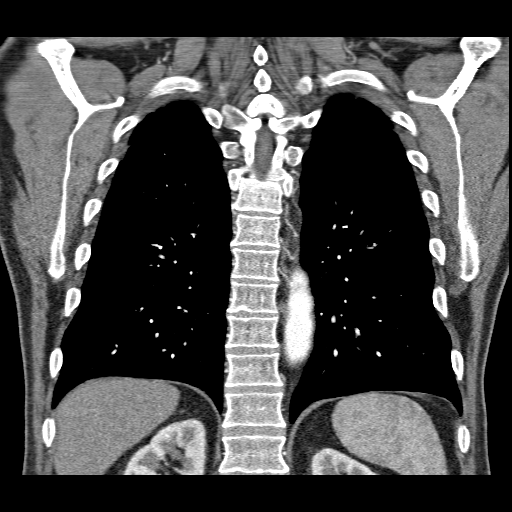

[17 of 46 positions shown; findings below may reference images not displayed]

FINDINGS: There is heavy calcification of the aortic valve.
Bicuspid aortic valve and the is suspected, as described on prior
cardiac MRI.  At the level of the sinus of Valsalva, the root of
the aorta measures 4.8 cm AP diameter on the axial images, and on
the coronal reformatted images measures 4.8 cm transverse diameter.
Distal to the sinus of Valsalva, the ascending aorta measures
cm AP diameter.  The thoracic aortic arch is within normal limits.
The descending aorta measures 2.9 cm AP diameter.

Negative for aortic dissection.

There is mild cardiomegaly with left ventricular wall thickening,
consistent with history of aortic stenosis.  Coronary artery
atherosclerosis is present.

Pulmonary arteries are normal in size.  No focal filling defects
are identified in the pulmonary arterial tree.

Negative for pleural or pericardial effusion.

Thyroid gland and supraclavicular spaces are unremarkable.
Negative for axillary, mediastinal, hilar lymphadenopathy.

Mild centrilobular and paracentral emphysema is present.  Negative
for airspace disease.  Negative for pneumothorax.  The trachea and
mainstem bronchi are patent.  Thoracic spine vertebral bodies are
normal in height and alignment.  No acute or suspicious bony
abnormality.

No acute or suspicious findings are identified in the upper
abdomen.

Review of the MIP images confirms the above findings.
IMPRESSION: 1.  Dilatation of the root of the thoracic aorta. At the level of
the sinus of Valsalva, the aorta measures 4.8 cm maximal diameter.
This is similar compared to cardiac MRI of March 2008 which
reported a 4.5 cm diameter of the aortic root.
2.  Heavy calcification of the aortic valve.  This can be seen in
the setting of aortic stenosis.
3.  Left ventricular wall thickening, likely related to aortic
stenosis.
4.  Coronary artery atherosclerosis.
5.  Mild centrilobular and paracentral emphysema.
6.  Negative for dissection or pulmonary embolism.

## 2012-08-25 ENCOUNTER — Ambulatory Visit (INDEPENDENT_AMBULATORY_CARE_PROVIDER_SITE_OTHER): Payer: PRIVATE HEALTH INSURANCE

## 2012-08-25 DIAGNOSIS — Q231 Congenital insufficiency of aortic valve: Secondary | ICD-10-CM

## 2012-08-25 DIAGNOSIS — I4891 Unspecified atrial fibrillation: Secondary | ICD-10-CM

## 2012-08-25 LAB — POCT INR: INR: 2.1

## 2012-09-22 ENCOUNTER — Ambulatory Visit (INDEPENDENT_AMBULATORY_CARE_PROVIDER_SITE_OTHER): Payer: PRIVATE HEALTH INSURANCE | Admitting: *Deleted

## 2012-09-22 DIAGNOSIS — I4891 Unspecified atrial fibrillation: Secondary | ICD-10-CM

## 2012-09-22 DIAGNOSIS — Q231 Congenital insufficiency of aortic valve: Secondary | ICD-10-CM

## 2012-09-22 LAB — POCT INR: INR: 2.1

## 2012-10-25 ENCOUNTER — Ambulatory Visit (INDEPENDENT_AMBULATORY_CARE_PROVIDER_SITE_OTHER): Payer: PRIVATE HEALTH INSURANCE | Admitting: *Deleted

## 2012-10-25 DIAGNOSIS — I4891 Unspecified atrial fibrillation: Secondary | ICD-10-CM

## 2012-10-25 DIAGNOSIS — Q231 Congenital insufficiency of aortic valve: Secondary | ICD-10-CM

## 2012-10-25 LAB — POCT INR: INR: 2.4

## 2012-11-30 ENCOUNTER — Ambulatory Visit (INDEPENDENT_AMBULATORY_CARE_PROVIDER_SITE_OTHER): Payer: PRIVATE HEALTH INSURANCE | Admitting: Pharmacist

## 2012-11-30 DIAGNOSIS — Q231 Congenital insufficiency of aortic valve: Secondary | ICD-10-CM

## 2012-11-30 DIAGNOSIS — I4891 Unspecified atrial fibrillation: Secondary | ICD-10-CM

## 2013-01-03 ENCOUNTER — Ambulatory Visit (INDEPENDENT_AMBULATORY_CARE_PROVIDER_SITE_OTHER): Payer: BC Managed Care – PPO | Admitting: Pharmacist

## 2013-01-03 DIAGNOSIS — I4891 Unspecified atrial fibrillation: Secondary | ICD-10-CM

## 2013-01-03 DIAGNOSIS — Q231 Congenital insufficiency of aortic valve: Secondary | ICD-10-CM

## 2013-01-16 ENCOUNTER — Ambulatory Visit (INDEPENDENT_AMBULATORY_CARE_PROVIDER_SITE_OTHER): Payer: BC Managed Care – PPO | Admitting: Ophthalmology

## 2013-01-16 DIAGNOSIS — H43819 Vitreous degeneration, unspecified eye: Secondary | ICD-10-CM

## 2013-01-16 DIAGNOSIS — H2510 Age-related nuclear cataract, unspecified eye: Secondary | ICD-10-CM

## 2013-01-16 DIAGNOSIS — H35039 Hypertensive retinopathy, unspecified eye: Secondary | ICD-10-CM

## 2013-01-16 DIAGNOSIS — H35439 Paving stone degeneration of retina, unspecified eye: Secondary | ICD-10-CM

## 2013-01-16 DIAGNOSIS — I10 Essential (primary) hypertension: Secondary | ICD-10-CM

## 2013-01-31 ENCOUNTER — Ambulatory Visit (INDEPENDENT_AMBULATORY_CARE_PROVIDER_SITE_OTHER): Payer: BC Managed Care – PPO | Admitting: *Deleted

## 2013-01-31 DIAGNOSIS — Q231 Congenital insufficiency of aortic valve: Secondary | ICD-10-CM

## 2013-01-31 DIAGNOSIS — I4891 Unspecified atrial fibrillation: Secondary | ICD-10-CM

## 2013-01-31 LAB — POCT INR: INR: 2.7

## 2013-02-05 ENCOUNTER — Other Ambulatory Visit: Payer: Self-pay | Admitting: *Deleted

## 2013-02-05 MED ORDER — WARFARIN SODIUM 2.5 MG PO TABS: ORAL_TABLET | ORAL | Status: DC

## 2013-02-28 ENCOUNTER — Ambulatory Visit (INDEPENDENT_AMBULATORY_CARE_PROVIDER_SITE_OTHER): Payer: BC Managed Care – PPO | Admitting: *Deleted

## 2013-02-28 DIAGNOSIS — Q231 Congenital insufficiency of aortic valve: Secondary | ICD-10-CM

## 2013-02-28 DIAGNOSIS — I4891 Unspecified atrial fibrillation: Secondary | ICD-10-CM

## 2013-03-27 ENCOUNTER — Ambulatory Visit (INDEPENDENT_AMBULATORY_CARE_PROVIDER_SITE_OTHER): Payer: BC Managed Care – PPO | Admitting: *Deleted

## 2013-03-27 DIAGNOSIS — Q231 Congenital insufficiency of aortic valve: Secondary | ICD-10-CM

## 2013-03-27 DIAGNOSIS — I4891 Unspecified atrial fibrillation: Secondary | ICD-10-CM

## 2013-04-10 ENCOUNTER — Ambulatory Visit (INDEPENDENT_AMBULATORY_CARE_PROVIDER_SITE_OTHER): Payer: BC Managed Care – PPO

## 2013-04-10 DIAGNOSIS — I4891 Unspecified atrial fibrillation: Secondary | ICD-10-CM

## 2013-04-10 DIAGNOSIS — Q231 Congenital insufficiency of aortic valve: Secondary | ICD-10-CM

## 2013-04-19 ENCOUNTER — Other Ambulatory Visit: Payer: Self-pay | Admitting: *Deleted

## 2013-04-19 MED ORDER — METOPROLOL TARTRATE 25 MG PO TABS: 12.5000 mg | ORAL_TABLET | Freq: Two times a day (BID) | ORAL | Status: AC

## 2013-04-24 ENCOUNTER — Telehealth: Payer: Self-pay

## 2013-05-03 ENCOUNTER — Ambulatory Visit (INDEPENDENT_AMBULATORY_CARE_PROVIDER_SITE_OTHER): Payer: BC Managed Care – PPO

## 2013-05-03 DIAGNOSIS — Q231 Congenital insufficiency of aortic valve: Secondary | ICD-10-CM

## 2013-05-03 DIAGNOSIS — I4891 Unspecified atrial fibrillation: Secondary | ICD-10-CM

## 2013-05-24 ENCOUNTER — Ambulatory Visit (INDEPENDENT_AMBULATORY_CARE_PROVIDER_SITE_OTHER): Payer: BC Managed Care – PPO

## 2013-05-24 DIAGNOSIS — Q231 Congenital insufficiency of aortic valve: Secondary | ICD-10-CM

## 2013-05-24 DIAGNOSIS — I4891 Unspecified atrial fibrillation: Secondary | ICD-10-CM

## 2013-06-21 ENCOUNTER — Ambulatory Visit (INDEPENDENT_AMBULATORY_CARE_PROVIDER_SITE_OTHER): Payer: BC Managed Care – PPO | Admitting: *Deleted

## 2013-06-21 DIAGNOSIS — I4891 Unspecified atrial fibrillation: Secondary | ICD-10-CM

## 2013-06-21 DIAGNOSIS — Q231 Congenital insufficiency of aortic valve: Secondary | ICD-10-CM

## 2013-07-09 ENCOUNTER — Ambulatory Visit (INDEPENDENT_AMBULATORY_CARE_PROVIDER_SITE_OTHER): Payer: BC Managed Care – PPO | Admitting: *Deleted

## 2013-07-09 DIAGNOSIS — I4891 Unspecified atrial fibrillation: Secondary | ICD-10-CM

## 2013-07-09 DIAGNOSIS — Q231 Congenital insufficiency of aortic valve: Secondary | ICD-10-CM

## 2013-07-20 ENCOUNTER — Telehealth: Payer: Self-pay | Admitting: *Deleted

## 2013-07-20 MED ORDER — WARFARIN SODIUM 2.5 MG PO TABS: ORAL_TABLET | ORAL | Status: DC

## 2013-07-20 NOTE — Telephone Encounter (Signed)
Pt needs Warfarin refilled

## 2013-08-09 ENCOUNTER — Encounter: Payer: Self-pay | Admitting: *Deleted

## 2013-08-09 ENCOUNTER — Ambulatory Visit (INDEPENDENT_AMBULATORY_CARE_PROVIDER_SITE_OTHER): Payer: BC Managed Care – PPO | Admitting: *Deleted

## 2013-08-09 ENCOUNTER — Encounter: Payer: Self-pay | Admitting: Cardiovascular Disease

## 2013-08-09 ENCOUNTER — Ambulatory Visit (INDEPENDENT_AMBULATORY_CARE_PROVIDER_SITE_OTHER): Payer: BC Managed Care – PPO | Admitting: Cardiovascular Disease

## 2013-08-09 VITALS — BP 146/76 | HR 86 | Ht 76.0 in | Wt 210.0 lb

## 2013-08-09 DIAGNOSIS — I4891 Unspecified atrial fibrillation: Secondary | ICD-10-CM

## 2013-08-09 DIAGNOSIS — I359 Nonrheumatic aortic valve disorder, unspecified: Secondary | ICD-10-CM

## 2013-08-09 DIAGNOSIS — I10 Essential (primary) hypertension: Secondary | ICD-10-CM

## 2013-08-09 DIAGNOSIS — Q231 Congenital insufficiency of aortic valve: Secondary | ICD-10-CM

## 2013-08-09 NOTE — Assessment & Plan Note (Signed)
Well controlled.  Continue current medications and low sodium Dash type diet.    

## 2013-08-09 NOTE — Progress Notes (Signed)
Patient ID: Alexander Payne, male   DOB: April 23, 1951, 62 y.o.   MRN: [id number] The patient returned to my office today for followup status post Bentall  procedure on December 03, 2010. He did well initially postoperatively  but then was readmitted on December 11, 2010, with sudden development of  weakness and shortness of breath and was diagnosed with a pericardial  tamponade. 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 Had an uncomplicated course after the pericardial window, he was  discharged home. INR;s have been Rx  Echo 12/29/10 with normal valve EF 60% moderate LVH and no effusion  Considering checking INR at home Living in [facility] now No bleeding problems  ROS: Denies fever, malais, weight loss, blurry vision, decreased visual acuity, cough, sputum, SOB, hemoptysis, pleuritic pain, palpitaitons, heartburn, abdominal pain, melena, lower extremity edema, claudication, or rash.  All other systems reviewed and negative  General: Affect appropriate Healthy:  appears stated age HEENT: normal Neck supple with no adenopathy JVP normal no bruits no thyromegaly Lungs clear with no wheezing and good diaphragmatic motion Heart:  S1/S2 click SEM no AR murmur, no rub, gallop or click PMI normal Abdomen: benighn, BS positve, no tenderness, no AAA no bruit.  No HSM or HJR Distal pulses intact with no bruits No edema Neuro non-focal Skin warm and dry No muscular weakness   Current Outpatient Prescriptions  Medication Sig Dispense Refill  . CLINDAMYCIN HCL PO Take by mouth. WILL TAKE BEFORE DENTAL PROCEDURES      . metoprolol tartrate (LOPRESSOR) 25 MG tablet Take 0.5 tablets (12.5 mg total) by mouth 2 (two) times daily.  90 tablet  3  . Multiple Vitamin (MULTIVITAMIN) capsule Take 1 capsule by mouth daily.        Marland Kitchen warfarin (COUMADIN) 2.5 MG tablet Take as directed by Anticoagulation clinic  180 tablet  0   No current  facility-administered medications for this visit.    Allergies  Amoxicillin  Electrocardiogram:  SR ratte 86 LAE lateral T wave changes no different than 8/13  Assessment and Plan

## 2013-08-09 NOTE — Patient Instructions (Addendum)
The current medical regimen is effective;  continue present plan and medications.  Follow up in 1 year with Dr Hochrein.  You will receive a letter in the mail 2 months before you are due.  Please call us when you receive this letter to schedule your follow up appointment.  

## 2013-08-09 NOTE — Assessment & Plan Note (Signed)
S/P AVR with normal valve sounds SBE with clindimycin may arrange home INR checks

## 2013-08-09 NOTE — Assessment & Plan Note (Signed)
Maint NSR anticoagulated for AVR

## 2013-09-11 ENCOUNTER — Encounter: Payer: Self-pay | Admitting: Cardiovascular Disease

## 2013-09-11 ENCOUNTER — Ambulatory Visit (INDEPENDENT_AMBULATORY_CARE_PROVIDER_SITE_OTHER): Payer: BC Managed Care – PPO | Admitting: Pharmacist

## 2013-09-11 ENCOUNTER — Encounter: Payer: Self-pay | Admitting: Family Medicine

## 2013-09-11 DIAGNOSIS — Q231 Congenital insufficiency of aortic valve: Secondary | ICD-10-CM

## 2013-09-11 DIAGNOSIS — I4891 Unspecified atrial fibrillation: Secondary | ICD-10-CM

## 2013-10-10 ENCOUNTER — Encounter: Payer: Self-pay | Admitting: Cardiovascular Disease

## 2013-10-16 ENCOUNTER — Encounter: Payer: Self-pay | Admitting: Cardiovascular Disease

## 2013-10-16 LAB — PROTIME-INR

## 2013-10-22 ENCOUNTER — Encounter: Payer: Self-pay | Admitting: Cardiovascular Disease

## 2013-10-26 ENCOUNTER — Telehealth: Payer: Self-pay | Admitting: Cardiovascular Disease

## 2013-10-26 MED ORDER — WARFARIN SODIUM 2.5 MG PO TABS: ORAL_TABLET | ORAL | Status: DC

## 2013-10-26 NOTE — Telephone Encounter (Signed)
Rx sent 

## 2013-10-29 ENCOUNTER — Telehealth: Payer: Self-pay

## 2013-10-30 NOTE — Telephone Encounter (Signed)
Pt has had INR checked by MD in [facility] until he is able to establish with new PCP.  Coumadin refilled on 10/31 by Roderick Pee.

## 2013-11-01 ENCOUNTER — Other Ambulatory Visit: Payer: Self-pay

## 2013-12-04 LAB — PROTIME-INR: INR: 2 — AB (ref 0.9–1.1)

## 2013-12-05 ENCOUNTER — Encounter: Payer: Self-pay | Admitting: Family Medicine

## 2013-12-05 ENCOUNTER — Ambulatory Visit (INDEPENDENT_AMBULATORY_CARE_PROVIDER_SITE_OTHER): Payer: BC Managed Care – PPO | Admitting: Cardiovascular Disease

## 2013-12-05 DIAGNOSIS — I4891 Unspecified atrial fibrillation: Secondary | ICD-10-CM

## 2013-12-05 DIAGNOSIS — Q231 Congenital insufficiency of aortic valve: Secondary | ICD-10-CM

## 2013-12-05 NOTE — Progress Notes (Signed)
   Subjective:    Patient ID: Alexander Payne, male    DOB: 28-Aug-1951, 62 y.o.   MRN: [id number]  HPI    Review of Systems     Objective:   Physical Exam        Assessment & Plan:

## 2013-12-10 ENCOUNTER — Encounter: Payer: Self-pay | Admitting: *Deleted

## 2013-12-11 ENCOUNTER — Encounter: Payer: Self-pay | Admitting: Cardiovascular Disease

## 2014-01-25 ENCOUNTER — Encounter: Payer: Self-pay | Admitting: Cardiovascular Disease

## 2014-01-25 ENCOUNTER — Telehealth: Payer: Self-pay | Admitting: Cardiovascular Disease

## 2014-01-25 NOTE — Telephone Encounter (Signed)
Pt Called asking If His records have Been Sent to [facility], I told Him I cannot Send Records until I receive a Release I called [place] spoke with Otila Kluver And asked Her to Fax me the Signed [facility] so I could Gets Records Faxed there. She stated She Will fax It to me Once she gets a Chance.  I have called Pt back and he is aware Of all

## 2014-01-28 ENCOUNTER — Other Ambulatory Visit: Payer: Self-pay | Admitting: *Deleted

## 2014-01-28 MED ORDER — WARFARIN SODIUM 2.5 MG PO TABS: ORAL_TABLET | ORAL | Status: AC

## 2014-01-28 NOTE — Telephone Encounter (Signed)
In the process of establishing with MD at [facility], see note 01/25/2014, refilled warfarin for 2 weeks

## 2014-01-29 ENCOUNTER — Ambulatory Visit: Payer: Self-pay | Admitting: Pharmacist

## 2014-01-29 DIAGNOSIS — Q231 Congenital insufficiency of aortic valve: Secondary | ICD-10-CM

## 2014-01-29 DIAGNOSIS — I4891 Unspecified atrial fibrillation: Secondary | ICD-10-CM

## 2014-01-29 LAB — PROTIME-INR: INR: 2.1 — AB (ref 0.9–1.1)

## 2015-03-17 ENCOUNTER — Encounter: Payer: Self-pay | Admitting: Internal Medicine

## 2015-08-05 ENCOUNTER — Encounter: Payer: Self-pay | Admitting: Internal Medicine

## 2016-01-09 ENCOUNTER — Encounter: Payer: Self-pay | Admitting: Cardiovascular Disease
